# Patient Record
Sex: Female | Born: 1960 | Race: Black or African American | Hispanic: No | Marital: Married | Smoking: Never smoker
Health system: Southern US, Community
[De-identification: ages and names within clinical notes are randomized; demographics above are authoritative.]

## PROBLEM LIST (undated history)

## (undated) DIAGNOSIS — F329 Major depressive disorder, single episode, unspecified: Secondary | ICD-10-CM

## (undated) DIAGNOSIS — E669 Obesity, unspecified: Secondary | ICD-10-CM

## (undated) DIAGNOSIS — F32A Depression, unspecified: Secondary | ICD-10-CM

## (undated) DIAGNOSIS — R197 Diarrhea, unspecified: Secondary | ICD-10-CM

## (undated) DIAGNOSIS — G709 Myoneural disorder, unspecified: Secondary | ICD-10-CM

## (undated) DIAGNOSIS — G4733 Obstructive sleep apnea (adult) (pediatric): Secondary | ICD-10-CM

## (undated) DIAGNOSIS — I10 Essential (primary) hypertension: Secondary | ICD-10-CM

## (undated) DIAGNOSIS — F419 Anxiety disorder, unspecified: Secondary | ICD-10-CM

## (undated) DIAGNOSIS — R7303 Prediabetes: Secondary | ICD-10-CM

## (undated) DIAGNOSIS — G4730 Sleep apnea, unspecified: Secondary | ICD-10-CM

## (undated) HISTORY — DX: Diarrhea, unspecified: R19.7

---

## 2005-01-03 HISTORY — PX: ABDOMINAL HYSTERECTOMY: SHX81

## 2007-07-10 ENCOUNTER — Ambulatory Visit: Payer: Self-pay | Admitting: Urology

## 2007-08-24 ENCOUNTER — Ambulatory Visit: Payer: Self-pay | Admitting: Urology

## 2011-06-14 ENCOUNTER — Ambulatory Visit: Payer: Self-pay | Admitting: Obstetrics and Gynecology

## 2011-07-12 ENCOUNTER — Ambulatory Visit: Payer: Self-pay | Admitting: Obstetrics and Gynecology

## 2011-07-25 ENCOUNTER — Ambulatory Visit: Payer: Self-pay | Admitting: Family Medicine

## 2011-12-19 ENCOUNTER — Ambulatory Visit: Payer: Self-pay | Admitting: Gastroenterology

## 2012-01-04 HISTORY — PX: CHOLECYSTECTOMY: SHX55

## 2012-01-09 ENCOUNTER — Ambulatory Visit: Payer: Self-pay | Admitting: Emergency Medicine

## 2012-01-09 LAB — HEPATIC FUNCTION PANEL A (ARMC)
Albumin: 3.7 g/dL (ref 3.4–5.0)
Alkaline Phosphatase: 76 U/L (ref 50–136)
Bilirubin, Direct: 0.1 mg/dL (ref 0.00–0.20)
SGPT (ALT): 31 U/L (ref 12–78)
Total Protein: 8.4 g/dL — ABNORMAL HIGH (ref 6.4–8.2)

## 2012-01-12 ENCOUNTER — Ambulatory Visit: Payer: Self-pay | Admitting: General Surgery

## 2012-01-13 LAB — COMPREHENSIVE METABOLIC PANEL
Albumin: 3.1 g/dL — ABNORMAL LOW (ref 3.4–5.0)
Alkaline Phosphatase: 68 U/L (ref 50–136)
BUN: 7 mg/dL (ref 7–18)
Bilirubin,Total: 0.7 mg/dL (ref 0.2–1.0)
Calcium, Total: 8.4 mg/dL — ABNORMAL LOW (ref 8.5–10.1)
Chloride: 106 mmol/L (ref 98–107)
EGFR (Non-African Amer.): >60
SGOT(AST): 42 U/L — ABNORMAL HIGH (ref 15–37)
SGPT (ALT): 81 U/L — ABNORMAL HIGH (ref 12–78)

## 2012-01-13 LAB — CBC WITH DIFFERENTIAL/PLATELET
Basophil #: 0 10*3/uL (ref 0.0–0.1)
Eosinophil #: 0 10*3/uL (ref 0.0–0.7)
Eosinophil %: 0.2 %
HCT: 34.7 % — ABNORMAL LOW (ref 35.0–47.0)
Lymphocyte #: 2.3 10*3/uL (ref 1.0–3.6)
MCH: 27.3 pg (ref 26.0–34.0)
MCHC: 32.9 g/dL (ref 32.0–36.0)
MCV: 83 fL (ref 80–100)
Neutrophil #: 4.6 10*3/uL (ref 1.4–6.5)

## 2012-01-16 LAB — WOUND CULTURE

## 2012-01-16 LAB — PATHOLOGY REPORT

## 2013-12-09 LAB — HM MAMMOGRAPHY: HM MAMMO: NORMAL

## 2014-04-04 LAB — HM PAP SMEAR: HM Pap smear: NORMAL

## 2014-04-25 NOTE — Discharge Summary (Signed)
PATIENT NAME:  SALINDA, SNEDEKER MR#:  [id number] DATE OF BIRTH:  [birth year]  DATE OF ADMISSION:  01/12/2012 DATE OF DISCHARGE:  01/15/2012  This patient had a laparoscopic cholecystectomy performed without any problem. The patient postoperatively was nauseated and was complaining of a lot of abdominal pain. She was then admitted in the hospital and during that hospital stay, she was also complaining of some dizziness, so consultation was then taken with the hospitalist, who followed her medically. She was discharged home on symptomatic treatment with hyoscine and prilosec 40 mg daily and Zofran for nausea and vomiting. She was discharged from the hospital in satisfactory condition. She will be followed in my office.    ____________________________ Welford Roche. Phylis Bougie, MD msh:aw D: 01/27/2012 12:51:46 ET T: 01/27/2012 14:12:57 ET JOB#: [id number]  cc: Eunice Winecoff S. Phylis Bougie, MD, <Dictator> Sharene Butters MD ELECTRONICALLY SIGNED 01/29/2012 13:38

## 2014-04-25 NOTE — Op Note (Signed)
PATIENT NAME:  Miranda Evans, Miranda Evans MR#:  [id number] DATE OF BIRTH:  1960/07/21  DATE OF PROCEDURE:  01/12/2012   PREOPERATIVE DIAGNOSIS: Acute cholecystitis.   POSTOPERATIVE DIAGNOSIS: Acute cholecystitis.   PROCEDURE PERFORMED: Laparoscopic cholecystectomy.   SURGEON: Sharene Butters, M.D.   INDICATIONS: This patient was seen by me in my office, with right upper quadrant abdominal pain and the patient had a low ejection fraction. The patient was mostly pain in the right upper quadrant. She had an upper endoscopy, as well as colonoscopy, which was negative.   OPERATIVE FINDINGS: The patient had a lot of adhesions on the surface of the gallbladder, as well as a very distended gallbladder.   DESCRIPTION OF PROCEDURE: Under general anesthesia, the abdomen was then prepped and draped. A small incision was made above the umbilicus. After cutting skin and subcutaneous tissue, the fascia was cut. The abdomen was entered. Another trocar was inserted from the epigastric region under direct vision and two 5 mm were put in the right upper quadrant of the abdomen. The gallbladder first of all was distended and small.  It could not be grasped with the grasper, so I just decompressed from there and then grasped with a grasper and I was not able to lift completely above from the liver.  It was a little hard dissection.  Dissection was done, dissecting out all the adhesions down to the cystic duct/cystic artery area. The cystic artery was visualized and was found very nicely and then was clipped and cut. After that, the cystic duct was small. It was then dissected out completely. It was then clipped and cut. After that was done, the gallbladder was then removed from the liver bed and delivered.  Bleeding from the liver bed was stopped with the Bovie and the gallbladder was removed from the epigastric port. After that,         irrigation of the abdomen was performed which showed there was no bleeding and no  biliary leakage and then all the trocars were removed under direct vision.  The umbilical port was then closed with 0-Vicryl interrupted sutures. Marcaine was injected and then the wound was closed with staples. The patient tolerated the procedure well and was sent to the recovery room in satisfactory condition.   ____________________________ Welford Roche Phylis Bougie, MD msh:eg D: 01/12/2012 15:18:24 ET T: 01/12/2012 19:23:27 ET JOB#: [id number]  cc: Keni Elison S. Phylis Bougie, MD, <Dictator> Sharene Butters MD ELECTRONICALLY SIGNED 01/17/2012 16:27

## 2014-04-25 NOTE — Consult Note (Signed)
Brief Consult Note: Diagnosis: dizziness and lightheadedness.   Patient was seen by consultant.   Consult note dictated.   Discussed with Attending MD.  Electronic Signatures: James Ivanoff, Roselie Awkward (MD)  (Signed 10-Jan-14 14:41)  Authored: Brief Consult Note   Last Updated: 10-Jan-14 14:41 by James Ivanoff, Roselie Awkward (MD)

## 2014-04-25 NOTE — Consult Note (Signed)
PATIENT NAME:  Miranda Evans, Miranda Evans MR#:  [id number] DATE OF BIRTH:  05/02/1960  DATE OF CONSULTATION:  01/13/2012  REFERRING PHYSICIAN:  Masud S. Phylis Bougie, MD CONSULTING PHYSICIAN:  [place] Sink, MD  PRIMARY CARE PHYSICIAN:  REASON FOR CONSULTATION: Dizziness, lightheadedness. The patient does not feel very well to be discharged.   HISTORY OF PRESENT ILLNESS: The patient is a nice 54 year old female who has history of chronic abdominal pain. She has irritable bowel syndrome, and for about a year she has been having significant abdominal pain that is located in the epigastric area. The patient has had several tests done, workup done, including EGD and colonoscopy that did not show any specific findings. Ultrasound and CT scan of the abdomen were done as well without any major abnormalities. She had a HIDA scan which showed lower gallbladder function, for what she was referred to Dr. Phylis Bougie and underwent a cholecystectomy laparoscopically on 01/12/2012. The patient did not have any significant complications. She actually did quite well, but today she is not feeling very well. She was dizzy and lightheaded earlier and then she got up, walked around the hall and almost collapsed. She was grabbed by her family. There was never any shortness of breath, chest pain or loss of consciousness. She was just severely dizzy and lightheaded. She does not have any significant jaundice. No hematemesis. No changes of the color of the urine or stool. She has been nauseous today, but not vomiting. She states that she has not had a bowel movement in more than 6 days. Her regular bowel movements come daily. I was asked to assist on the case by Dr. Phylis Bougie due to the patient not feeling very well and her dizzy episode.   REVIEW OF SYSTEMS: CONSTITUTIONAL: Denies any fever, fatigue, weakness prior to surgery. Today she feels really weak. No weight loss or weight gain.  EYES: No blurry vision. No double vision. No  cataracts. No glaucoma.  ENT: No tinnitus. No difficulty swallowing. No upper respiratory signs of infection.  RESPIRATORY: No cough. No wheezing. No hemoptysis.  CARDIOVASCULAR: No chest pain, no orthopnea, no syncopal episodes, no palpitations, no arrhythmias.  GASTROINTESTINAL: Positive nausea, positive vomiting yesterday. No diarrhea. Positive constipation for over 6 days. Positive abdominal pain after surgery, epigastric mostly, radiating to the mesogastric area. The patient has abdominal distention. She has not passed any gas since prior to surgery. No rectal bleeding. No melena.  GENITOURINARY: No dysuria or hematuria. No frequency or incontinence.  GYNECOLOGIC: No breast masses. No vaginal bleeding or significant vaginal discharge. Actually, the patient had a hysterectomy.  ENDOCRINE: No polyuria, polydipsia or polyphagia. No cold or heat intolerance.  HEMATOLOGIC, LYMPHATIC: No anemia, no easy bruising, no swollen glands that she knows of, although she has not had any hemoglobin checked prior to surgery.  MUSCULOSKELETAL: No significant neck pain, back pain, shoulder pain. No swelling. No gout.  NEUROLOGIC: No numbness. No tingling. No CVA. No TIA. No ataxia.  PSYCHIATRIC: The patient denies any significant anxiety or depression. She has not been treated for any psychiatric circumstances.   ALLERGIES: No known drug allergies.   PAST MEDICAL HISTORY:  1.  IBS.  2.  Chronic abdominal pain.   PAST SURGICAL HISTORY:  1.  Partial hysterectomy.  2.  Laparoscopic cholecystectomy on 01/12/2012.   SOCIAL HISTORY: The patient has never smoked. She does not really drink. She denies any IV drug abuse.   FAMILY HISTORY: Positive for coronary artery disease in her father, who also has  diabetes. Her mother had hypertension and colon cancer.   CURRENT MEDICATIONS: Omeprazole 20 mg b.i.d., Protonix 40 mg twice daily (recently started and omeprazole has been discontinued), metoclopramide p.r.n.,  Zofran p.r.n. and Tylox 5/325 mg every four hours p.r.n. pain. She is using lactated Ringer's at 125 an hour at the moment.   PHYSICAL EXAMINATION:  VITAL SIGNS: Blood pressure 112/74, pulse 85, respiratory rate 18, temperature 98.4, o2 sats 99% on room air.  GENERAL: The patient is alert, oriented x 3, no acute distress, no respiratory distress, hemodynamically stable.  HEENT: Pupils are equal and reactive. Extraocular movements are intact. Mucosa are a little bit dry. Anicteric sclerae. Pink conjunctivae. No oral lesions. No oropharyngeal exudates. No thrush.  NECK: Supple. No JVD. No thyromegaly. No adenopathy. No carotid bruits. Normal range of motion. No rigidity.  CARDIOVASCULAR: Regular rate and rhythm. No murmurs, rubs or gallops. No tenderness to palpation of the anterior chest. There is no displacement of PMI.  LUNGS: Clear without any wheezing or crepitus. No use of accessory muscles.  ABDOMEN: Distended but soft. Tender to palpation on the epigastric and right upper quadrant but no rebound, no guarding. Bowel sounds are decreased on the distal area, mildly increased on the superior aspect of the abdomen.  GENITALIA: Deferred.  EXTREMITIES: No edema, no cyanosis, no clubbing. Pulses +2. Capillary refill less than 3.  NEUROLOGIC: Cranial nerves II through XII grossly intact. No focal findings.  PSYCHIATRIC: Mood: The patient seems very upset with her situation, not happy at this moment, and she is very short to answer questions with just "no" or "I don't know," so she has to be asked several times for the same questions. She looks a little bit tired and somnolent whenever you stop asking her questions.  MUSCULOSKELETAL: No significant lesions on joints or joint exudates.  LYMPHATIC: Negative for lymphadenopathy in neck or supraclavicular areas.   LABORATORY DATA: No hemoglobin seen on her chart or on this hospitalization. The patient had normal LFTs and a culture from gallbladder that is  not growing any bacteria.   ASSESSMENT AND PLAN: A 54 year old female, patient of Dr. Phylis Bougie, who comes for chronic abdominal pain and a HIDA scan that showed decreased gallbladder function, for what she underwent a cholecystectomy yesterday. Surgery was done successfully, but today she had a dizzy spell, almost fell while ambulating on the floor, and I was asked to assist on the case today.  1.  Status post cholecystectomy. The patient is doing fine. So far, there are no signs of infection. No signs of complications.  2.  Dizzy spell. The patient had dizziness, but it was related with ambulation. It did not happen related to any chest pain, shortness of breath or other cardiac instability. Her vital signs have been stable. She is getting good amount of fluids. Her blood pressure was 112/74 and her heart rate was mildly elevated yesterday in the 100s, right now is around 95 to 85. I am going to obtain orthostatic vital signs. I am going to check a hemoglobin and check all her chemistries. The patient is to continue same amount of fluids for now. Also, I would recommend to slow down on the dose of narcotics since the patient looks a little somnolent during the interview and this could be the major player on her symptomatology. Will follow up on those results.  3.  Significant constipation, abdominal pain. The patient is distended, has not had a bowel movement in over 6 days. I am going to  obtain a KUB, but this is likely an ileus due to constipation or obstipation, for what I will recommend stool softeners and catharsis.  4.  Irritable bowel syndrome. Continue general measurements.  5.  Deep vein thrombosis prophylaxis. The patient needs to ambulate a little bit more. If she does not, will consider putting her on heparin subcutaneously. At this moment it will be safe after her surgery, but first we are going to check her CBC.  6.  She is a full code.   TIME SPENT: I spent about 45 minutes talking with the  patient and family and reviewing the chart and talking with the attending.  Thank you, Dr. Phylis Bougie, for allowing me to participate in the care of this really nice patient.    ____________________________ [place] Sink, MD rsg:jm D: 01/13/2012 14:56:49 ET T: 01/13/2012 17:36:50 ET JOB#: [id number]  cc: [place] Sink, MD, <Dictator> Clio Gerhart America Brown MD ELECTRONICALLY SIGNED 01/27/2012 10:53

## 2014-08-20 ENCOUNTER — Ambulatory Visit (INDEPENDENT_AMBULATORY_CARE_PROVIDER_SITE_OTHER): Payer: BLUE CROSS/BLUE SHIELD | Admitting: Nurse Practitioner

## 2014-08-20 ENCOUNTER — Encounter: Payer: Self-pay | Admitting: Nurse Practitioner

## 2014-08-20 ENCOUNTER — Encounter (INDEPENDENT_AMBULATORY_CARE_PROVIDER_SITE_OTHER): Payer: Self-pay

## 2014-08-20 VITALS — BP 118/78 | HR 90 | Temp 98.2°F | Resp 14 | Ht 66.0 in | Wt 220.8 lb

## 2014-08-20 DIAGNOSIS — M545 Low back pain, unspecified: Secondary | ICD-10-CM | POA: Insufficient documentation

## 2014-08-20 DIAGNOSIS — Z7189 Other specified counseling: Secondary | ICD-10-CM | POA: Diagnosis not present

## 2014-08-20 DIAGNOSIS — Z7689 Persons encountering health services in other specified circumstances: Secondary | ICD-10-CM

## 2014-08-20 MED ORDER — CYCLOBENZAPRINE HCL 5 MG PO TABS: 5.0000 mg | ORAL_TABLET | Freq: Every day | ORAL | Status: DC

## 2014-08-20 NOTE — Progress Notes (Addendum)
Patient ID: Miranda Evans, female    DOB: 09/15/1960  Age: 54 y.o. MRN: [id number]  CC: Establish Care   HPI Miranda Evans presents for establishing care and CC of lower left back pain x 4 days.   1) New pt info:   Immunizations- unknown   Mammogram- Dec. 2015 Normal   Pap- 04/2014 last pelvic exam; Partial Hysterectomy    May keep GYN   Colonoscopy- 3 years ago could not go through with it  Eye Exam- 02/2014 approx; glasses   2) Chronic Problems-    None  3) Acute Problems-   Back pain- Moved a heavy box at work, Monday was sore/warm/aching into left posterior thigh. Pt denies burning/stinging/numbness/tingling. Denies prior episodes  Aleve- helpful   History Miranda has no past medical history on file.   She has past surgical history that includes Cholecystectomy (2014) and Abdominal hysterectomy (2007).   Her family history includes Cancer in her brother, maternal aunt, maternal uncle, paternal aunt, and paternal uncle; Diabetes in her father, maternal aunt, maternal uncle, paternal aunt, and paternal uncle; Hyperlipidemia in her maternal aunt and mother; Hypertension in her maternal aunt and mother.She reports that she has never smoked. She has never used smokeless tobacco. She reports that she drinks alcohol. She reports that she does not use illicit drugs.  No outpatient prescriptions prior to visit.   No facility-administered medications prior to visit.    ROS Review of Systems  Constitutional: Negative for fever, chills, diaphoresis and fatigue.  Respiratory: Negative for chest tightness, shortness of breath and wheezing.   Cardiovascular: Negative for chest pain, palpitations and leg swelling.  Gastrointestinal: Negative for nausea, vomiting and diarrhea.  Musculoskeletal: Positive for back pain. Negative for myalgias, joint swelling, arthralgias, gait problem, neck pain and neck stiffness.  Skin: Negative for rash.  Neurological: Negative for dizziness, weakness,  numbness and headaches.  Psychiatric/Behavioral: The patient is not nervous/anxious.     Objective:  BP 118/78 mmHg  Pulse 90  Temp(Src) 98.2 F (36.8 C)  Resp 14  Ht 5\' 6"  (1.676 m)  Wt 220 lb 12.8 oz (100.154 kg)  BMI 35.65 kg/m2  SpO2 95%  Physical Exam  Constitutional: She is oriented to person, place, and time. She appears well-developed and well-nourished. No distress.  HENT:  Head: Normocephalic and atraumatic.  Right Ear: External ear normal.  Left Ear: External ear normal.  2 well-healed scars on left cheek  Cardiovascular: Normal rate, regular rhythm and normal heart sounds.  Exam reveals no gallop and no friction rub.   No murmur heard. Pulmonary/Chest: Effort normal and breath sounds normal. No respiratory distress. She has no wheezes. She has no rales. She exhibits no tenderness.  Musculoskeletal: Normal range of motion. She exhibits tenderness. She exhibits no edema.  Tender to palpation of paraspinal muscles mainly on left negative straight leg raise   Neurological: She is alert and oriented to person, place, and time. No cranial nerve deficit. She exhibits normal muscle tone. Coordination normal.  Skin: Skin is warm and dry. No rash noted. She is not diaphoretic.  Psychiatric: She has a normal mood and affect. Her behavior is normal. Judgment and thought content normal.    Assessment & Plan:   Miranda Evans was seen today for establish care.  Diagnoses and all orders for this visit:  Encounter to establish care  Left-sided low back pain without sciatica  Other orders -     cyclobenzaprine (FLEXERIL) 5 MG tablet; Take 1 tablet (5 mg  total) by mouth at bedtime.   [name]  Meds ordered this encounter  Medications  . cyclobenzaprine (FLEXERIL) 5 MG tablet    Sig: Take 1 tablet (5 mg total) by mouth at bedtime.    Dispense:  30 tablet    Refill:  0    Order Specific Question:  Supervising Provider    Answer:  Crecencio Mc [[id number]]     Follow-up: Return in about 6 months (around 02/20/2015) for f/u.

## 2014-08-20 NOTE — Patient Instructions (Signed)
Welcome to [facility]!   Follow up in 6 months or sooner if sick.   Flexeril 1 tablet at night for your back, do light stretching and ice as needed. Call me if it worsens. Advil/ibuprofen is okay to continue.

## 2014-08-20 NOTE — Assessment & Plan Note (Signed)
Discussed acute and chronic issues. Reviewed health maintenance measures, PFSHx, and immunizations. Obtain records from previous facility.   

## 2014-08-20 NOTE — Progress Notes (Signed)
Pre visit review using our clinic review tool, if applicable. No additional management support is needed unless otherwise documented below in the visit note. 

## 2014-08-20 NOTE — Assessment & Plan Note (Signed)
Probable muscle spasm. Patient encouraged to use ice, rest, and NSAIDs. Patient was given Flexeril 5 mg to take at nighttime. Discouraged driving or using heavy machinery. Patient encouraged to call if conservative therapy is not helpful discussed possible use of prednisone taper.

## 2014-08-27 ENCOUNTER — Telehealth: Payer: Self-pay | Admitting: *Deleted

## 2014-08-28 ENCOUNTER — Other Ambulatory Visit: Payer: Self-pay | Admitting: Nurse Practitioner

## 2014-08-28 MED ORDER — PREDNISONE 10 MG PO TABS: ORAL_TABLET | ORAL | Status: DC

## 2014-08-28 NOTE — Telephone Encounter (Signed)
Awesome thanks! Sent to [facility] [place] [facility]

## 2014-08-28 NOTE — Telephone Encounter (Signed)
We can try a prednisone taper if she is agreeable. This will decrease inflammation and she cannot take any NSAIDs while on this. Thanks!

## 2014-08-28 NOTE — Telephone Encounter (Signed)
Spoke with pt, she is agreeable to the Prednisone taper.  Pt verbalizes understanding of no NSAIDS.  Please send Rx

## 2014-08-28 NOTE — Telephone Encounter (Signed)
No note attached. Please re-send.

## 2014-08-28 NOTE — Telephone Encounter (Signed)
Pt called states the muscle relaxer is not effective.  Please advise.

## 2015-04-16 ENCOUNTER — Emergency Department: Payer: No Typology Code available for payment source

## 2015-04-16 ENCOUNTER — Emergency Department
Admission: EM | Admit: 2015-04-16 | Discharge: 2015-04-16 | Disposition: A | Discharge status: Home / Self Care | Payer: No Typology Code available for payment source | Attending: Emergency Medicine | Admitting: Emergency Medicine

## 2015-04-16 ENCOUNTER — Encounter: Payer: Self-pay | Admitting: Emergency Medicine

## 2015-04-16 DIAGNOSIS — S161XXA Strain of muscle, fascia and tendon at neck level, initial encounter: Secondary | ICD-10-CM | POA: Diagnosis not present

## 2015-04-16 DIAGNOSIS — Y999 Unspecified external cause status: Secondary | ICD-10-CM | POA: Diagnosis not present

## 2015-04-16 DIAGNOSIS — Y9389 Activity, other specified: Secondary | ICD-10-CM | POA: Insufficient documentation

## 2015-04-16 DIAGNOSIS — M25512 Pain in left shoulder: Secondary | ICD-10-CM | POA: Diagnosis not present

## 2015-04-16 DIAGNOSIS — S4992XA Unspecified injury of left shoulder and upper arm, initial encounter: Secondary | ICD-10-CM | POA: Diagnosis present

## 2015-04-16 DIAGNOSIS — Y92410 Unspecified street and highway as the place of occurrence of the external cause: Secondary | ICD-10-CM | POA: Insufficient documentation

## 2015-04-16 MED ORDER — ETODOLAC 400 MG PO TABS: 400.0000 mg | ORAL_TABLET | Freq: Two times a day (BID) | ORAL | Status: DC

## 2015-04-16 MED ORDER — CYCLOBENZAPRINE HCL 5 MG PO TABS: 5.0000 mg | ORAL_TABLET | Freq: Three times a day (TID) | ORAL | Status: DC | PRN

## 2015-04-16 NOTE — ED Provider Notes (Signed)
ED ECG REPORT I, QUALE, MARK, the attending physician, personally viewed and interpreted this ECG.  Date: 04/16/2015 EKG Time: 858 Rate: 90 Rhythm: normal sinus rhythm QRS Axis: normal Intervals: normal ST/T Wave abnormalities: normal Conduction Disturbances: none Narrative Interpretation: unremarkable   Delman Kitten, MD 04/16/15 [phone]
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
## 2015-04-16 NOTE — Discharge Instructions (Signed)
Follow-up with your primary care doctor if any continued problems. You need to call and make an appointment. Begin taking medication as directed. Etodolac twice a day with food and Flexeril 5 mg 3 times a day as needed for muscle spasms. Moist heat or ice to muscles as needed for comfort.

## 2015-04-22 ENCOUNTER — Encounter: Payer: Self-pay | Admitting: Nurse Practitioner

## 2015-04-22 ENCOUNTER — Ambulatory Visit (INDEPENDENT_AMBULATORY_CARE_PROVIDER_SITE_OTHER): Payer: BLUE CROSS/BLUE SHIELD | Admitting: Nurse Practitioner

## 2015-04-22 DIAGNOSIS — M25512 Pain in left shoulder: Secondary | ICD-10-CM

## 2015-04-22 DIAGNOSIS — T148XXA Other injury of unspecified body region, initial encounter: Secondary | ICD-10-CM

## 2015-04-22 DIAGNOSIS — T1490 Injury, unspecified: Secondary | ICD-10-CM | POA: Diagnosis not present

## 2015-04-22 DIAGNOSIS — S161XXD Strain of muscle, fascia and tendon at neck level, subsequent encounter: Secondary | ICD-10-CM

## 2015-04-22 MED ORDER — ALL-BODY MASSAGE MISC: 1.0000 "application " | Status: DC

## 2015-04-22 MED ORDER — PREDNISONE 10 MG PO TABS: ORAL_TABLET | ORAL | Status: DC
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
## 2015-04-22 NOTE — Patient Instructions (Addendum)
Prednisone with breakfast or lunch at the latest.  6 tablets on day 1, 5 tablets on day 2, 4 tablets on day 3, 3 tablets on day 4, 2 tablets day 5, 1 tablet on day 6...done! Take tablets all together not spaced out Don't take with NSAIDs (Ibuprofen, Aleve, Naproxen, Meloxicam, Etodolac ect...)  Can still take flexeril at night  Drop off FMLA paperwork at front desk

## 2015-04-23 ENCOUNTER — Telehealth: Payer: Self-pay | Admitting: Nurse Practitioner

## 2015-04-23 NOTE — Telephone Encounter (Signed)
Pt dropped of [employer] papers for Lorane Gell to complete. Papers are in C. [place]oss box.

## 2015-04-24 NOTE — Telephone Encounter (Signed)
Received. Will work on them

## 2015-04-26 DIAGNOSIS — S161XXA Strain of muscle, fascia and tendon at neck level, initial encounter: Secondary | ICD-10-CM | POA: Insufficient documentation

## 2015-04-26 DIAGNOSIS — M25512 Pain in left shoulder: Secondary | ICD-10-CM | POA: Insufficient documentation

## 2015-04-26 DIAGNOSIS — T148XXA Other injury of unspecified body region, initial encounter: Secondary | ICD-10-CM | POA: Insufficient documentation

## 2015-04-26 NOTE — Assessment & Plan Note (Signed)
Same as tx for myalgias below

## 2015-04-26 NOTE — Assessment & Plan Note (Signed)
X-rays normal See Myalgias for tx regimen Added Massage x 3, 1 every 2 weeks for 1 hour to help relieve tension

## 2015-04-26 NOTE — Assessment & Plan Note (Signed)
Multiple areas are still bothering her and causing her to have anxiety, poor sleep, and feeling tense. Flexeril continue- prednisone sent to pharmacy- discussed no NSAIDs, tylenol okay, and how to take on AVS. FU in approx 2 weeks

## 2015-04-26 NOTE — Assessment & Plan Note (Signed)
See ED note for more information and imaging

## 2015-05-01 NOTE — Telephone Encounter (Signed)
Patient had requested a follow on FMLA paperwork follow up Pt Contact [phone].

## 2015-05-01 NOTE — Telephone Encounter (Signed)
Patient states she dropped of FMLA paperwork 4/20 and needs completed forms OR a letter sent to her employer ([employer]) by Monday stating she is still out.     Fax attention to:  Earlie Lou, fax [phone], phone [phone]

## 2015-05-04 NOTE — Telephone Encounter (Signed)
Left a VM that paperwork is ready to be picked up.

## 2015-05-04 NOTE — Telephone Encounter (Signed)
Thanks

## 2015-05-06 ENCOUNTER — Encounter: Payer: Self-pay | Admitting: Nurse Practitioner

## 2015-05-06 ENCOUNTER — Ambulatory Visit (INDEPENDENT_AMBULATORY_CARE_PROVIDER_SITE_OTHER): Payer: Self-pay | Admitting: Nurse Practitioner

## 2015-05-06 DIAGNOSIS — M545 Low back pain, unspecified: Secondary | ICD-10-CM

## 2015-05-06 DIAGNOSIS — S161XXD Strain of muscle, fascia and tendon at neck level, subsequent encounter: Secondary | ICD-10-CM

## 2015-05-06 DIAGNOSIS — M25512 Pain in left shoulder: Secondary | ICD-10-CM

## 2015-05-06 DIAGNOSIS — T1490 Injury, unspecified: Secondary | ICD-10-CM

## 2015-05-06 DIAGNOSIS — T148XXA Other injury of unspecified body region, initial encounter: Secondary | ICD-10-CM

## 2015-05-06 MED ORDER — CYCLOBENZAPRINE HCL 5 MG PO TABS: 5.0000 mg | ORAL_TABLET | Freq: Every day | ORAL | Status: DC

## 2015-05-06 MED ORDER — ETODOLAC 400 MG PO TABS
400.0000 mg | ORAL_TABLET | Freq: Two times a day (BID) | ORAL | Status: DC
Start: 2015-05-06 — End: 2015-07-31

## 2015-05-06 NOTE — Assessment & Plan Note (Signed)
PT ordered Medication refills sent to pharmacy FU in 2 weeks

## 2015-05-06 NOTE — Assessment & Plan Note (Signed)
[employer] disability form filled out and given back to pt same day  Copy in folder above CMA station in purple folder kept for 1 month  Medications sent to pharmacy Out of work note for 2 more weeks FU in 2 weeks for re-eval PT ordered

## 2015-05-06 NOTE — Assessment & Plan Note (Signed)
Filled out [employer] disability page- returned to pt same day as visit and copy was kept at [facility] desk for 1 month.

## 2015-05-06 NOTE — Progress Notes (Signed)
Pre visit review using our clinic review tool, if applicable. No additional management support is needed unless otherwise documented below in the visit note. 

## 2015-05-06 NOTE — Patient Instructions (Signed)
We will call you about your referral to physical therapy.   Etodolac up to twice daily (am and pm) with food on your stomach only as needed for pain.   Flexeril at night for muscle relaxation.   Follow up in 2 weeks.
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
## 2015-05-15 ENCOUNTER — Ambulatory Visit (INDEPENDENT_AMBULATORY_CARE_PROVIDER_SITE_OTHER): Payer: Self-pay | Admitting: Nurse Practitioner

## 2015-05-15 ENCOUNTER — Encounter: Payer: Self-pay | Admitting: Nurse Practitioner

## 2015-05-15 VITALS — BP 128/80 | HR 112 | Resp 12 | Ht 66.0 in | Wt 221.0 lb

## 2015-05-15 DIAGNOSIS — S161XXD Strain of muscle, fascia and tendon at neck level, subsequent encounter: Secondary | ICD-10-CM

## 2015-05-15 DIAGNOSIS — T148XXA Other injury of unspecified body region, initial encounter: Secondary | ICD-10-CM

## 2015-05-15 DIAGNOSIS — T1490 Injury, unspecified: Secondary | ICD-10-CM

## 2015-05-15 DIAGNOSIS — M545 Low back pain, unspecified: Secondary | ICD-10-CM

## 2015-05-15 DIAGNOSIS — M25512 Pain in left shoulder: Secondary | ICD-10-CM

## 2015-05-15 MED ORDER — GABAPENTIN 300 MG PO CAPS: 300.0000 mg | ORAL_CAPSULE | Freq: Every day | ORAL | Status: DC

## 2015-05-15 NOTE — Patient Instructions (Addendum)
[facility]  Address: [address] [place], [place] [address]  Phone: [phone]  Gabapentin tonight!
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
## 2015-05-15 NOTE — Assessment & Plan Note (Addendum)
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

## 2015-05-15 NOTE — Assessment & Plan Note (Signed)
PT info given to pt to call and get set up Note for 5 weeks out of work- [employer] and Short term disability in place  Gabapentin for radicular pain of lumbar spine and possibly cervical

## 2015-06-05 ENCOUNTER — Ambulatory Visit
Admission: RE | Admit: 2015-06-05 | Discharge: 2015-06-05 | Disposition: A | Discharge status: Home / Self Care | Payer: No Typology Code available for payment source | Source: Ambulatory Visit | Attending: Family Medicine | Admitting: Family Medicine

## 2015-06-05 ENCOUNTER — Ambulatory Visit (INDEPENDENT_AMBULATORY_CARE_PROVIDER_SITE_OTHER): Payer: Self-pay | Admitting: Family Medicine

## 2015-06-05 ENCOUNTER — Telehealth: Payer: Self-pay | Admitting: Family Medicine

## 2015-06-05 ENCOUNTER — Encounter: Payer: Self-pay | Admitting: Family Medicine

## 2015-06-05 ENCOUNTER — Telehealth: Payer: Self-pay

## 2015-06-05 VITALS — BP 110/76 | HR 94 | Temp 98.2°F | Ht 66.0 in | Wt 221.6 lb

## 2015-06-05 DIAGNOSIS — R519 Headache, unspecified: Secondary | ICD-10-CM

## 2015-06-05 DIAGNOSIS — R51 Headache: Secondary | ICD-10-CM | POA: Insufficient documentation

## 2015-06-05 DIAGNOSIS — S161XXD Strain of muscle, fascia and tendon at neck level, subsequent encounter: Secondary | ICD-10-CM

## 2015-06-05 MED ORDER — PROMETHAZINE HCL 12.5 MG PO TABS: 12.5000 mg | ORAL_TABLET | Freq: Three times a day (TID) | ORAL | Status: DC | PRN

## 2015-06-05 NOTE — Assessment & Plan Note (Signed)
Suspect muscular back pain and neck pain related to muscular strain. Discussed continuing physical therapy and continuing gabapentin. She'll continue to monitor. Given return precautions.

## 2015-06-05 NOTE — Telephone Encounter (Signed)
Stat Results called to the office,   CT of the head: Normal CT.  Please advise. Thanks
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
## 2015-06-05 NOTE — Telephone Encounter (Signed)
Pt returned call for CT results.

## 2015-06-05 NOTE — Telephone Encounter (Signed)
Notified patient.

## 2015-06-05 NOTE — Patient Instructions (Signed)
Nice to see you. We are going to get the CT scan of her head given her headaches. We will start her on Phenergan for possible migraine component. You should continue physical therapy. You should continue your current medications. If he develops sudden worsening of your headaches, nausea, vomiting, vision changes, numbness, weakness, or any new or changing symptoms please seek medical attention.

## 2015-06-05 NOTE — Progress Notes (Signed)
Pre visit review using our clinic review tool, if applicable. No additional management support is needed unless otherwise documented below in the visit note. 
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
## 2015-06-19 ENCOUNTER — Ambulatory Visit (INDEPENDENT_AMBULATORY_CARE_PROVIDER_SITE_OTHER): Payer: BLUE CROSS/BLUE SHIELD

## 2015-06-19 ENCOUNTER — Encounter: Payer: Self-pay | Admitting: Family Medicine

## 2015-06-19 ENCOUNTER — Ambulatory Visit (INDEPENDENT_AMBULATORY_CARE_PROVIDER_SITE_OTHER): Payer: Self-pay | Admitting: Family Medicine

## 2015-06-19 ENCOUNTER — Encounter: Payer: Self-pay | Admitting: Surgical

## 2015-06-19 DIAGNOSIS — M545 Low back pain, unspecified: Secondary | ICD-10-CM

## 2015-06-19 MED ORDER — PREDNISONE 10 MG PO TABS: ORAL_TABLET | ORAL | Status: DC

## 2015-06-19 NOTE — Patient Instructions (Signed)
Nice to meet you. We will start you on prednisone to help with the inflammation.  We will obtain an x-ray of your low back today and MRI in the next several weeks.  Please continue the gabapentin. Do not take the lodine while taking the prednisone.  If you develop worsening back pain, numbness, weakness, loss of bowel or bladder function, numbness between her legs, fevers, or any new or changing symptoms please seek medical attention.
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
## 2015-06-19 NOTE — Progress Notes (Signed)
Pre visit review using our clinic review tool, if applicable. No additional management support is needed unless otherwise documented below in the visit note. 

## 2015-06-19 NOTE — Assessment & Plan Note (Signed)
Patient with persistent low back pain. Now worse on the right than the left. Sciatic on the right. Did note one to 2 episodes of leakage of a small amount of urine several weeks ago though no recurrence. No other red flags. Neurologically intact. Given persistence and lack of prior imaging will obtain an x-ray of her lumbar spine today. She will need an MRI though not emergently given lack of recurrent urinary issues. We'll give a trial of prednisone given her radicular symptoms. She'll continue to monitor. She is given return precautions.

## 2015-07-03 ENCOUNTER — Ambulatory Visit (INDEPENDENT_AMBULATORY_CARE_PROVIDER_SITE_OTHER): Payer: BLUE CROSS/BLUE SHIELD | Admitting: Family Medicine

## 2015-07-03 ENCOUNTER — Encounter: Payer: Self-pay | Admitting: Family Medicine

## 2015-07-03 VITALS — BP 132/84 | HR 103 | Temp 98.3°F | Ht 66.0 in | Wt 220.4 lb

## 2015-07-03 DIAGNOSIS — R079 Chest pain, unspecified: Secondary | ICD-10-CM | POA: Diagnosis not present

## 2015-07-03 DIAGNOSIS — R0789 Other chest pain: Secondary | ICD-10-CM

## 2015-07-03 DIAGNOSIS — M545 Low back pain, unspecified: Secondary | ICD-10-CM

## 2015-07-03 DIAGNOSIS — R1013 Epigastric pain: Secondary | ICD-10-CM | POA: Diagnosis not present

## 2015-07-03 DIAGNOSIS — R197 Diarrhea, unspecified: Secondary | ICD-10-CM

## 2015-07-03 DIAGNOSIS — G894 Chronic pain syndrome: Secondary | ICD-10-CM

## 2015-07-03 HISTORY — DX: Diarrhea, unspecified: R19.7

## 2015-07-03 LAB — COMPREHENSIVE METABOLIC PANEL
ALK PHOS: 79 U/L (ref 39–117)
ALT: 18 U/L (ref 0–35)
AST: 11 U/L (ref 0–37)
Albumin: 4.1 g/dL (ref 3.5–5.2)
BILIRUBIN TOTAL: 0.6 mg/dL (ref 0.2–1.2)
BUN: 18 mg/dL (ref 6–23)
CALCIUM: 9.3 mg/dL (ref 8.4–10.5)
CO2: 29 mEq/L (ref 19–32)
Chloride: 103 mEq/L (ref 96–112)
Creatinine, Ser: 0.79 mg/dL (ref 0.40–1.20)
GFR: 97.87 mL/min (ref >60.00)
Glucose, Bld: 103 mg/dL — ABNORMAL HIGH (ref 70–99)
POTASSIUM: 4.1 meq/L (ref 3.5–5.1)
Sodium: 139 mEq/L (ref 135–145)
TOTAL PROTEIN: 7.8 g/dL (ref 6.0–8.3)

## 2015-07-03 LAB — CBC
HEMATOCRIT: 41 % (ref 36.0–46.0)
HEMOGLOBIN: 13.4 g/dL (ref 12.0–15.0)
MCHC: 32.7 g/dL (ref 30.0–36.0)
MCV: 82.3 fl (ref 78.0–100.0)
PLATELETS: 324 10*3/uL (ref 150.0–400.0)
RBC: 4.99 Mil/uL (ref 3.87–5.11)
RDW: 14.1 % (ref 11.5–15.5)
WBC: 6.8 10*3/uL (ref 4.0–10.5)

## 2015-07-03 LAB — TROPONIN I: TNIDX: 0 ug/L (ref 0.00–0.06)

## 2015-07-03 LAB — LIPASE: Lipase: 12 U/L (ref 11.0–59.0)

## 2015-07-03 MED ORDER — PROMETHAZINE HCL 12.5 MG PO TABS: 12.5000 mg | ORAL_TABLET | Freq: Three times a day (TID) | ORAL | Status: DC | PRN

## 2015-07-03 MED ORDER — GABAPENTIN 300 MG PO CAPS: 300.0000 mg | ORAL_CAPSULE | Freq: Every day | ORAL | Status: DC
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
## 2015-07-03 NOTE — Assessment & Plan Note (Signed)
Patient with several days of diarrhea and some mild abdominal discomfort. Mild tenderness in the epigastric region. Symptoms could be related to medication versus viral infection. Given tenderness we will obtain labs as outlined below. She does have hemorrhoids and I advised to continue Preparation H as these appeared to have improved significantly recently. She'll stay well hydrated. Suspect some of her mild tachycardia could be related to dehydration from the diarrhea or discomfort from her chronic pain. She'll continue to monitor. She's given return precautions.

## 2015-07-03 NOTE — Assessment & Plan Note (Signed)
Recent MRI with foraminal narrowing. We will refer to neurosurgery.
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
## 2015-07-03 NOTE — Progress Notes (Signed)
Pre visit review using our clinic review tool, if applicable. No additional management support is needed unless otherwise documented below in the visit note. 
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
## 2015-07-03 NOTE — Assessment & Plan Note (Addendum)
Patient with atypical left-sided chest pain. Atypical in that it does not radiate, is not associated with shortness of breath, is not exertional, no associated diaphoresis, and is reproducible on exam. Also somewhat atypical that it has been constant for 3 days with some easing up. EKG reassuring. Suspect musculoskeletal cause, though given persistence and family history we will obtain a troponin to rule out cardiac disease though history is not consistent with cardiac issue. If cardiac cause would expect troponin to end up being positive after 3 days of discomfort. If negative highly doubt cardiac cause. Unlikely PE given normal oxygenation and wells score of 1.5 as low risk with no risk factors for PE. Unlikely pulmonary cause given normal pulmonary exam and oxygenation. History not consistent with GI cause. Could be musculoskeletal given reproducibility on exam. We'll await troponin and patient will monitor. She's given return precautions.

## 2015-07-14 ENCOUNTER — Telehealth: Payer: Self-pay | Admitting: Family Medicine

## 2015-07-14 NOTE — Telephone Encounter (Signed)
The patient has a question

## 2015-07-20 ENCOUNTER — Ambulatory Visit: Payer: BLUE CROSS/BLUE SHIELD | Admitting: Family Medicine

## 2015-07-23 ENCOUNTER — Telehealth: Payer: Self-pay | Admitting: *Deleted

## 2015-07-23 NOTE — Telephone Encounter (Signed)
Notified patient that we do not have the forms. She is going to have the company fax a copy to [place].

## 2015-07-23 NOTE — Telephone Encounter (Signed)
Do you have these forms?

## 2015-07-23 NOTE — Telephone Encounter (Signed)
Please advise, thanks.

## 2015-07-23 NOTE — Telephone Encounter (Signed)
Patient has requested a update on her short term disability paperwork, she also was checking on the progress of her referral.  Pt contact [phone]

## 2015-07-27 ENCOUNTER — Telehealth: Payer: Self-pay | Admitting: Family Medicine

## 2015-07-27 NOTE — Telephone Encounter (Signed)
Pt needs a cd of her xray that she had with [place]. She will pick it up on Friday while she is here for her appt with Dr. Caryl Bis.

## 2015-07-27 NOTE — Telephone Encounter (Signed)
CD has been made. Will place in pick up box up front.

## 2015-07-31 ENCOUNTER — Ambulatory Visit (INDEPENDENT_AMBULATORY_CARE_PROVIDER_SITE_OTHER): Payer: Self-pay | Admitting: Family Medicine

## 2015-07-31 ENCOUNTER — Encounter: Payer: Self-pay | Admitting: Surgical

## 2015-07-31 DIAGNOSIS — G894 Chronic pain syndrome: Secondary | ICD-10-CM

## 2015-07-31 MED ORDER — GABAPENTIN 300 MG PO CAPS
ORAL_CAPSULE | ORAL | 1 refills | Status: DC
Start: 2015-07-31 — End: 2016-02-12

## 2015-07-31 NOTE — Telephone Encounter (Signed)
Patient came in for appointment and spoke with insurance company. They are going to send different forms.

## 2015-07-31 NOTE — Patient Instructions (Signed)
Nice to see you. We will increase your gabapentin as outlined in the prescription. Please keep follow-up with Dr Joya Salm.  If you develop worsening discomfort, numbness, weakness, loss of bowel or bladder function, numbness between your legs, or any new or change in symptoms please seek medical attention.
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
## 2015-08-03 ENCOUNTER — Telehealth: Payer: Self-pay | Admitting: Family Medicine

## 2015-08-03 ENCOUNTER — Telehealth: Payer: Self-pay | Admitting: *Deleted

## 2015-08-03 ENCOUNTER — Ambulatory Visit: Payer: BLUE CROSS/BLUE SHIELD | Admitting: Family Medicine

## 2015-08-03 NOTE — Telephone Encounter (Signed)
Patient needs to contact neurosurgery and have them send [place] there office notes, thanks

## 2015-08-03 NOTE — Telephone Encounter (Signed)
Spoke with patient and advised that we can give her a work note to be out the next 2 days until we get the note from the Chief of Staff.

## 2015-08-03 NOTE — Telephone Encounter (Signed)
I'll need to see a copy of the office note from the neurosurgeon's office stating this. This was relayed to the patient by CMA. Please check with the patient to see if she contacted the neurosurgeon's office regarding this.

## 2015-08-03 NOTE — Telephone Encounter (Signed)
FYI Patient stated that progress notes from Neurosurgery will be available in 1-2 days.

## 2015-08-03 NOTE — Telephone Encounter (Signed)
Pt dropped off paper work for Dr. Caryl Bis. Paper is in a envelope in colored folder up front.

## 2015-08-03 NOTE — Telephone Encounter (Signed)
Patient stated that Neuro surgeon and spin stated that she should not return to work until after her MRI. She stated that she will need a note from her PCP stating the request. Her MRI has not been scheduled. She also had a fall this weekend that left her in pain.

## 2015-08-03 NOTE — Telephone Encounter (Signed)
Spoke with patient and advised per Dr. Caryl Bis that if we have to do the work note that she would need to contact the Neuro surgeon to contact Dr. Caryl Bis. She stated that the neuro sugreon said to get the letter from her PCP.

## 2015-08-03 NOTE — Telephone Encounter (Signed)
Please advise for work note, thanks

## 2015-08-03 NOTE — Telephone Encounter (Signed)
Notified patient that note for work will be up front for her to pick up. She said that the note from neuro surgery will note be ready for 1-2 days

## 2015-08-03 NOTE — Telephone Encounter (Signed)
Letter created and forwarded to CMA.

## 2015-08-04 NOTE — Telephone Encounter (Signed)
Faxed forms

## 2015-08-06 ENCOUNTER — Telehealth: Payer: Self-pay | Admitting: *Deleted

## 2015-08-06 NOTE — Telephone Encounter (Signed)
Patient stated that she will return back to work on 08/07/15. She has requested a restriction form. She also requested that [name] call her  Pt contact [phone]

## 2015-08-06 NOTE — Telephone Encounter (Signed)
Spoke with nurse at [facility] about doctors note for Miranda Evans. She stated that the doctor has not signed it yet. She did read some of the note for me and said that nothing was mentioned in the note about patients work statis. She did say that after the MRI is performed then they will decide on if the patient needs surgery or pain management.

## 2015-08-06 NOTE — Telephone Encounter (Signed)
Form up front for patient to pick up

## 2015-08-06 NOTE — Telephone Encounter (Signed)
Form completed and given to [facility] to place at the front.

## 2015-08-18 ENCOUNTER — Other Ambulatory Visit: Payer: Self-pay | Admitting: Neurosurgery

## 2015-08-18 DIAGNOSIS — M542 Cervicalgia: Secondary | ICD-10-CM

## 2015-08-27 ENCOUNTER — Ambulatory Visit
Admission: RE | Admit: 2015-08-27 | Discharge: 2015-08-27 | Disposition: A | Discharge status: Home / Self Care | Payer: BLUE CROSS/BLUE SHIELD | Source: Ambulatory Visit | Attending: Neurosurgery | Admitting: Neurosurgery

## 2015-08-27 DIAGNOSIS — M4802 Spinal stenosis, cervical region: Secondary | ICD-10-CM | POA: Insufficient documentation

## 2015-08-27 DIAGNOSIS — M50222 Other cervical disc displacement at C5-C6 level: Secondary | ICD-10-CM | POA: Insufficient documentation

## 2015-08-27 DIAGNOSIS — M542 Cervicalgia: Secondary | ICD-10-CM | POA: Diagnosis present

## 2015-08-31 ENCOUNTER — Telehealth: Payer: Self-pay | Admitting: Family Medicine

## 2015-08-31 MED ORDER — PROMETHAZINE HCL 12.5 MG PO TABS
12.5000 mg | ORAL_TABLET | Freq: Three times a day (TID) | ORAL | 0 refills | Status: DC | PRN
Start: 2015-08-31 — End: 2016-04-19

## 2015-08-31 NOTE — Telephone Encounter (Signed)
Please advise if this was received and completed? thanks

## 2015-08-31 NOTE — Telephone Encounter (Signed)
Spoke with patient and advised that we have not received any papers to fill out from insurance yet. She stated that insurance company is going to mail [place] forms. Can we refill the promethazine? She stated that it helps with headaches.
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
## 2015-08-31 NOTE — Telephone Encounter (Signed)
Promethazine sent to pharmacy. We'll fill out the forms when we receive them.

## 2015-09-16 ENCOUNTER — Other Ambulatory Visit: Payer: Self-pay | Admitting: Family Medicine

## 2015-09-29 ENCOUNTER — Encounter: Payer: Self-pay | Admitting: Physical Medicine & Rehabilitation

## 2015-09-29 ENCOUNTER — Encounter
Payer: BLUE CROSS/BLUE SHIELD | Attending: Physical Medicine & Rehabilitation | Admitting: Physical Medicine & Rehabilitation

## 2015-09-29 VITALS — BP 115/77 | HR 95 | Resp 14

## 2015-09-29 DIAGNOSIS — M47812 Spondylosis without myelopathy or radiculopathy, cervical region: Secondary | ICD-10-CM | POA: Insufficient documentation

## 2015-09-29 DIAGNOSIS — M5441 Lumbago with sciatica, right side: Secondary | ICD-10-CM

## 2015-09-29 DIAGNOSIS — G894 Chronic pain syndrome: Secondary | ICD-10-CM

## 2015-09-29 DIAGNOSIS — M47892 Other spondylosis, cervical region: Secondary | ICD-10-CM | POA: Diagnosis not present

## 2015-09-29 DIAGNOSIS — S3992XD Unspecified injury of lower back, subsequent encounter: Secondary | ICD-10-CM | POA: Diagnosis present

## 2015-09-29 DIAGNOSIS — Z79899 Other long term (current) drug therapy: Secondary | ICD-10-CM

## 2015-09-29 DIAGNOSIS — Z5181 Encounter for therapeutic drug level monitoring: Secondary | ICD-10-CM

## 2015-09-29 MED ORDER — DICLOFENAC SODIUM 75 MG PO TBEC: 75.0000 mg | DELAYED_RELEASE_TABLET | Freq: Two times a day (BID) | ORAL | 3 refills | Status: DC

## 2015-09-29 MED ORDER — METHOCARBAMOL 500 MG PO TABS: 500.0000 mg | ORAL_TABLET | Freq: Four times a day (QID) | ORAL | 2 refills | Status: DC | PRN

## 2015-09-29 NOTE — Patient Instructions (Signed)
Impingement Syndrome, Rotator Cuff, Bursitis With Rehab Impingement syndrome is a condition that involves inflammation of the tendons of the rotator cuff and the subacromial bursa, that causes pain in the shoulder. The rotator cuff consists of four tendons and muscles that control much of the shoulder and upper arm function. The subacromial bursa is a fluid filled sac that helps reduce friction between the rotator cuff and one of the bones of the shoulder (acromion). Impingement syndrome is usually an overuse injury that causes swelling of the bursa (bursitis), swelling of the tendon (tendonitis), and/or a tear of the tendon (strain). Strains are classified into three categories. Grade 1 strains cause pain, but the tendon is not lengthened. Grade 2 strains include a lengthened ligament, due to the ligament being stretched or partially ruptured. With grade 2 strains there is still function, although the function may be decreased. Grade 3 strains include a complete tear of the tendon or muscle, and function is usually impaired. SYMPTOMS   Pain around the shoulder, often at the outer portion of the upper arm.  Pain that gets worse with shoulder function, especially when reaching overhead or lifting.  Sometimes, aching when not using the arm.  Pain that wakes you up at night.  Sometimes, tenderness, swelling, warmth, or redness over the affected area.  Loss of strength.  Limited motion of the shoulder, especially reaching behind the back (to the back pocket or to unhook bra) or across your body.  Crackling sound (crepitation) when moving the arm.  Biceps tendon pain and inflammation (in the front of the shoulder). Worse when bending the elbow or lifting. CAUSES  Impingement syndrome is often an overuse injury, in which chronic (repetitive) motions cause the tendons or bursa to become inflamed. A strain occurs when a force is paced on the tendon or muscle that is greater than it can withstand.  Common mechanisms of injury include: Stress from sudden increase in duration, frequency, or intensity of training.  Direct hit (trauma) to the shoulder.  Aging, erosion of the tendon with normal use.  Bony bump on shoulder (acromial spur). RISK INCREASES WITH:  Contact sports (football, wrestling, boxing).  Throwing sports (baseball, tennis, volleyball).  Weightlifting and bodybuilding.  Heavy labor.  Previous injury to the rotator cuff, including impingement.  Poor shoulder strength and flexibility.  Failure to warm up properly before activity.  Inadequate protective equipment.  Old age.  Bony bump on shoulder (acromial spur). PREVENTION   Warm up and stretch properly before activity.  Allow for adequate recovery between workouts.  Maintain physical fitness:  Strength, flexibility, and endurance.  Cardiovascular fitness.  Learn and use proper exercise technique. PROGNOSIS  If treated properly, impingement syndrome usually goes away within 6 weeks. Sometimes surgery is required.  RELATED COMPLICATIONS   Longer healing time if not properly treated, or if not given enough time to heal.  Recurring symptoms, that result in a chronic condition.  Shoulder stiffness, frozen shoulder, or loss of motion.  Rotator cuff tendon tear.  Recurring symptoms, especially if activity is resumed too soon, with overuse, with a direct blow, or when using poor technique. TREATMENT  Treatment first involves the use of ice and medicine, to reduce pain and inflammation. The use of strengthening and stretching exercises may help reduce pain with activity. These exercises may be performed at home or with a therapist. If non-surgical treatment is unsuccessful after more than 6 months, surgery may be advised. After surgery and rehabilitation, activity is usually possible in 3 months.    MEDICATION  If pain medicine is needed, nonsteroidal anti-inflammatory medicines (aspirin and  ibuprofen), or other minor pain relievers (acetaminophen), are often advised.  Do not take pain medicine for 7 days before surgery.  Prescription pain relievers may be given, if your caregiver thinks they are needed. Use only as directed and only as much as you need.  Corticosteroid injections may be given by your caregiver. These injections should be reserved for the most serious cases, because they may only be given a certain number of times. HEAT AND COLD  Cold treatment (icing) should be applied for 10 to 15 minutes every 2 to 3 hours for inflammation and pain, and immediately after activity that aggravates your symptoms. Use ice packs or an ice massage.  Heat treatment may be used before performing stretching and strengthening activities prescribed by your caregiver, physical therapist, or athletic trainer. Use a heat pack or a warm water soak. SEEK MEDICAL CARE IF:   Symptoms get worse or do not improve in 4 to 6 weeks, despite treatment.  New, unexplained symptoms develop. (Drugs used in treatment may produce side effects.) EXERCISES  RANGE OF MOTION (ROM) AND STRETCHING EXERCISES - Impingement Syndrome (Rotator Cuff  Tendinitis, Bursitis) These exercises may help you when beginning to rehabilitate your injury. Your symptoms may go away with or without further involvement from your physician, physical therapist or athletic trainer. While completing these exercises, remember:   Restoring tissue flexibility helps normal motion to return to the joints. This allows healthier, less painful movement and activity.  An effective stretch should be held for at least 30 seconds.  A stretch should never be painful. You should only feel a gentle lengthening or release in the stretched tissue. STRETCH - Flexion, Standing  Stand with good posture. With an underhand grip on your right / left hand, and an overhand grip on the opposite hand, grasp a broomstick or cane so that your hands are a  little more than shoulder width apart.  Keeping your right / left elbow straight and shoulder muscles relaxed, push the stick with your opposite hand, to raise your right / left arm in front of your body and then overhead. Raise your arm until you feel a stretch in your right / left shoulder, but before you have increased shoulder pain.  Try to avoid shrugging your right / left shoulder as your arm rises, by keeping your shoulder blade tucked down and toward your mid-back spine. Hold for __________ seconds.  Slowly return to the starting position. Repeat __________ times. Complete this exercise __________ times per day. STRETCH - Abduction, Supine  Lie on your back. With an underhand grip on your right / left hand and an overhand grip on the opposite hand, grasp a broomstick or cane so that your hands are a little more than shoulder width apart.  Keeping your right / left elbow straight and your shoulder muscles relaxed, push the stick with your opposite hand, to raise your right / left arm out to the side of your body and then overhead. Raise your arm until you feel a stretch in your right / left shoulder, but before you have increased shoulder pain.  Try to avoid shrugging your right / left shoulder as your arm rises, by keeping your shoulder blade tucked down and toward your mid-back spine. Hold for __________ seconds.  Slowly return to the starting position. Repeat __________ times. Complete this exercise __________ times per day. ROM - Flexion, Active-Assisted  Lie on your back.   You may bend your knees for comfort.  Grasp a broomstick or cane so your hands are about shoulder width apart. Your right / left hand should grip the end of the stick, so that your hand is positioned "thumbs-up," as if you were about to shake hands.  Using your healthy arm to lead, raise your right / left arm overhead, until you feel a gentle stretch in your shoulder. Hold for __________ seconds.  Use the stick  to assist in returning your right / left arm to its starting position. Repeat __________ times. Complete this exercise __________ times per day.  ROM - Internal Rotation, Supine   Lie on your back on a firm surface. Place your right / left elbow about 60 degrees away from your side. Elevate your elbow with a folded towel, so that the elbow and shoulder are the same height.  Using a broomstick or cane and your strong arm, pull your right / left hand toward your body until you feel a gentle stretch, but no increase in your shoulder pain. Keep your shoulder and elbow in place throughout the exercise.  Hold for __________ seconds. Slowly return to the starting position. Repeat __________ times. Complete this exercise __________ times per day. STRETCH - Internal Rotation  Place your right / left hand behind your back, palm up.  Throw a towel or belt over your opposite shoulder. Grasp the towel with your right / left hand.  While keeping an upright posture, gently pull up on the towel, until you feel a stretch in the front of your right / left shoulder.  Avoid shrugging your right / left shoulder as your arm rises, by keeping your shoulder blade tucked down and toward your mid-back spine.  Hold for __________ seconds. Release the stretch, by lowering your healthy hand. Repeat __________ times. Complete this exercise __________ times per day. ROM - Internal Rotation   Using an underhand grip, grasp a stick behind your back with both hands.  While standing upright with good posture, slide the stick up your back until you feel a mild stretch in the front of your shoulder.  Hold for __________ seconds. Slowly return to your starting position. Repeat __________ times. Complete this exercise __________ times per day.  STRETCH - Posterior Shoulder Capsule   Stand or sit with good posture. Grasp your right / left elbow and draw it across your chest, keeping it at the same height as your  shoulder.  Pull your elbow, so your upper arm comes in closer to your chest. Pull until you feel a gentle stretch in the back of your shoulder.  Hold for __________ seconds. Repeat __________ times. Complete this exercise __________ times per day. STRENGTHENING EXERCISES - Impingement Syndrome (Rotator Cuff Tendinitis, Bursitis) These exercises may help you when beginning to rehabilitate your injury. They may resolve your symptoms with or without further involvement from your physician, physical therapist or athletic trainer. While completing these exercises, remember:  Muscles can gain both the endurance and the strength needed for everyday activities through controlled exercises.  Complete these exercises as instructed by your physician, physical therapist or athletic trainer. Increase the resistance and repetitions only as guided.  You may experience muscle soreness or fatigue, but the pain or discomfort you are trying to eliminate should never worsen during these exercises. If this pain does get worse, stop and make sure you are following the directions exactly. If the pain is still present after adjustments, discontinue the exercise until you can discuss   the trouble with your clinician.  During your recovery, avoid activity or exercises which involve actions that place your injured hand or elbow above your head or behind your back or head. These positions stress the tissues which you are trying to heal. STRENGTH - Scapular Depression and Adduction   With good posture, sit on a firm chair. Support your arms in front of you, with pillows, arm rests, or on a table top. Have your elbows in line with the sides of your body.  Gently draw your shoulder blades down and toward your mid-back spine. Gradually increase the tension, without tensing the muscles along the top of your shoulders and the back of your neck.  Hold for __________ seconds. Slowly release the tension and relax your muscles  completely before starting the next repetition.  After you have practiced this exercise, remove the arm support and complete the exercise in standing as well as sitting position. Repeat __________ times. Complete this exercise __________ times per day.  STRENGTH - Shoulder Abductors, Isometric  With good posture, stand or sit about 4-6 inches from a wall, with your right / left side facing the wall.  Bend your right / left elbow. Gently press your right / left elbow into the wall. Increase the pressure gradually, until you are pressing as hard as you can, without shrugging your shoulder or increasing any shoulder discomfort.  Hold for __________ seconds.  Release the tension slowly. Relax your shoulder muscles completely before you begin the next repetition. Repeat __________ times. Complete this exercise __________ times per day.  STRENGTH - External Rotators, Isometric  Keep your right / left elbow at your side and bend it 90 degrees.  Step into a door frame so that the outside of your right / left wrist can press against the door frame without your upper arm leaving your side.  Gently press your right / left wrist into the door frame, as if you were trying to swing the back of your hand away from your stomach. Gradually increase the tension, until you are pressing as hard as you can, without shrugging your shoulder or increasing any shoulder discomfort.  Hold for __________ seconds.  Release the tension slowly. Relax your shoulder muscles completely before you begin the next repetition. Repeat __________ times. Complete this exercise __________ times per day.  STRENGTH - Supraspinatus   Stand or sit with good posture. Grasp a __________ weight, or an exercise band or tubing, so that your hand is "thumbs-up," like you are shaking hands.  Slowly lift your right / left arm in a "V" away from your thigh, diagonally into the space between your side and straight ahead. Lift your hand to  shoulder height or as far as you can, without increasing any shoulder pain. At first, many people do not lift their hands above shoulder height.  Avoid shrugging your right / left shoulder as your arm rises, by keeping your shoulder blade tucked down and toward your mid-back spine.  Hold for __________ seconds. Control the descent of your hand, as you slowly return to your starting position. Repeat __________ times. Complete this exercise __________ times per day.  STRENGTH - External Rotators  Secure a rubber exercise band or tubing to a fixed object (table, pole) so that it is at the same height as your right / left elbow when you are standing or sitting on a firm surface.  Stand or sit so that the secured exercise band is at your uninjured side.  Bend   your right / left elbow 90 degrees. Place a folded towel or small pillow under your right / left arm, so that your elbow is a few inches away from your side.  Keeping the tension on the exercise band, pull it away from your body, as if pivoting on your elbow. Be sure to keep your body steady, so that the movement is coming only from your rotating shoulder.  Hold for __________ seconds. Release the tension in a controlled manner, as you return to the starting position. Repeat __________ times. Complete this exercise __________ times per day.  STRENGTH - Internal Rotators   Secure a rubber exercise band or tubing to a fixed object (table, pole) so that it is at the same height as your right / left elbow when you are standing or sitting on a firm surface.  Stand or sit so that the secured exercise band is at your right / left side.  Bend your elbow 90 degrees. Place a folded towel or small pillow under your right / left arm so that your elbow is a few inches away from your side.  Keeping the tension on the exercise band, pull it across your body, toward your stomach. Be sure to keep your body steady, so that the movement is coming only from  your rotating shoulder.  Hold for __________ seconds. Release the tension in a controlled manner, as you return to the starting position. Repeat __________ times. Complete this exercise __________ times per day.  STRENGTH - Scapular Protractors, Standing   Stand arms length away from a wall. Place your hands on the wall, keeping your elbows straight.  Begin by dropping your shoulder blades down and toward your mid-back spine.  To strengthen your protractors, keep your shoulder blades down, but slide them forward on your rib cage. It will feel as if you are lifting the back of your rib cage away from the wall. This is a subtle motion and can be challenging to complete. Ask your caregiver for further instruction, if you are not sure you are doing the exercise correctly.  Hold for __________ seconds. Slowly return to the starting position, resting the muscles completely before starting the next repetition. Repeat __________ times. Complete this exercise __________ times per day. STRENGTH - Scapular Protractors, Supine  Lie on your back on a firm surface. Extend your right / left arm straight into the air while holding a __________ weight in your hand.  Keeping your head and back in place, lift your shoulder off the floor.  Hold for __________ seconds. Slowly return to the starting position, and allow your muscles to relax completely before starting the next repetition. Repeat __________ times. Complete this exercise __________ times per day. STRENGTH - Scapular Protractors, Quadruped  Get onto your hands and knees, with your shoulders directly over your hands (or as close as you can be, comfortably).  Keeping your elbows locked, lift the back of your rib cage up into your shoulder blades, so your mid-back rounds out. Keep your neck muscles relaxed.  Hold this position for __________ seconds. Slowly return to the starting position and allow your muscles to relax completely before starting the  next repetition. Repeat __________ times. Complete this exercise __________ times per day.  STRENGTH - Scapular Retractors  Secure a rubber exercise band or tubing to a fixed object (table, pole), so that it is at the height of your shoulders when you are either standing, or sitting on a firm armless chair.  With a   palm down grip, grasp an end of the band in each hand. Straighten your elbows and lift your hands straight in front of you, at shoulder height. Step back, away from the secured end of the band, until it becomes tense.  Squeezing your shoulder blades together, draw your elbows back toward your sides, as you bend them. Keep your upper arms lifted away from your body throughout the exercise.  Hold for __________ seconds. Slowly ease the tension on the band, as you reverse the directions and return to the starting position. Repeat __________ times. Complete this exercise __________ times per day. STRENGTH - Shoulder Extensors   Secure a rubber exercise band or tubing to a fixed object (table, pole) so that it is at the height of your shoulders when you are either standing, or sitting on a firm armless chair.  With a thumbs-up grip, grasp an end of the band in each hand. Straighten your elbows and lift your hands straight in front of you, at shoulder height. Step back, away from the secured end of the band, until it becomes tense.  Squeezing your shoulder blades together, pull your hands down to the sides of your thighs. Do not allow your hands to go behind you.  Hold for __________ seconds. Slowly ease the tension on the band, as you reverse the directions and return to the starting position. Repeat __________ times. Complete this exercise __________ times per day.  STRENGTH - Scapular Retractors and External Rotators   Secure a rubber exercise band or tubing to a fixed object (table, pole) so that it is at the height as your shoulders, when you are either standing, or sitting on a  firm armless chair.  With a palm down grip, grasp an end of the band in each hand. Bend your elbows 90 degrees and lift your elbows to shoulder height, at your sides. Step back, away from the secured end of the band, until it becomes tense.  Squeezing your shoulder blades together, rotate your shoulders so that your upper arms and elbows remain stationary, but your fists travel upward to head height.  Hold for __________ seconds. Slowly ease the tension on the band, as you reverse the directions and return to the starting position. Repeat __________ times. Complete this exercise __________ times per day.  STRENGTH - Scapular Retractors and External Rotators, Rowing   Secure a rubber exercise band or tubing to a fixed object (table, pole) so that it is at the height of your shoulders, when you are either standing, or sitting on a firm armless chair.  With a palm down grip, grasp an end of the band in each hand. Straighten your elbows and lift your hands straight in front of you, at shoulder height. Step back, away from the secured end of the band, until it becomes tense.  Step 1: Squeeze your shoulder blades together. Bending your elbows, draw your hands to your chest, as if you are rowing a boat. At the end of this motion, your hands and elbow should be at shoulder height and your elbows should be out to your sides.  Step 2: Rotate your shoulders, to raise your hands above your head. Your forearms should be vertical and your upper arms should be horizontal.  Hold for __________ seconds. Slowly ease the tension on the band, as you reverse the directions and return to the starting position. Repeat __________ times. Complete this exercise __________ times per day.  STRENGTH - Scapular Depressors  Find a sturdy chair   without wheels, such as a dining room chair.  Keeping your feet on the floor, and your hands on the chair arms, lift your bottom up from the seat, and lock your elbows.  Keeping  your elbows straight, allow gravity to pull your body weight down. Your shoulders will rise toward your ears.  Raise your body against gravity by drawing your shoulder blades down your back, shortening the distance between your shoulders and ears. Although your feet should always maintain contact with the floor, your feet should progressively support less body weight, as you get stronger.  Hold for __________ seconds. In a controlled and slow manner, lower your body weight to begin the next repetition. Repeat __________ times. Complete this exercise __________ times per day.    This information is not intended to replace advice given to you by your health care provider. Make sure you discuss any questions you have with your health care provider.   Document Released: 12/20/2004 Document Revised: 01/10/2014 Document Reviewed: 04/03/2008 [employer] Interactive Patient Education 2016 [employer]   Low Back Strain With Rehab A strain is an injury in which a tendon or muscle is torn. The muscles and tendons of the lower back are vulnerable to strains. However, these muscles and tendons are very strong and require a great force to be injured. Strains are classified into three categories. Grade 1 strains cause pain, but the tendon is not lengthened. Grade 2 strains include a lengthened ligament, due to the ligament being stretched or partially ruptured. With grade 2 strains there is still function, although the function may be decreased. Grade 3 strains involve a complete tear of the tendon or muscle, and function is usually impaired. SYMPTOMS   Pain in the lower back.  Pain that affects one side more than the other.  Pain that gets worse with movement and may be felt in the hip, buttocks, or back of the thigh.  Muscle spasms of the muscles in the back.  Swelling along the muscles of the back.  Loss of strength of the back muscles.  Crackling sound (crepitation) when the muscles are  touched. CAUSES  Lower back strains occur when a force is placed on the muscles or tendons that is greater than they can handle. Common causes of injury include:  Prolonged overuse of the muscle-tendon units in the lower back, usually from incorrect posture.  A single violent injury or force applied to the back. RISK INCREASES WITH:  Sports that involve twisting forces on the spine or a lot of bending at the waist (football, rugby, weightlifting, bowling, golf, tennis, speed skating, racquetball, swimming, running, gymnastics, diving).  Poor strength and flexibility.  Failure to warm up properly before activity.  Family history of lower back pain or disk disorders.  Previous back injury or surgery (especially fusion).  Poor posture with lifting, especially heavy objects.  Prolonged sitting, especially with poor posture. PREVENTION   Learn and use proper posture when sitting or lifting (maintain proper posture when sitting, lift using the knees and legs, not at the waist).  Warm up and stretch properly before activity.  Allow for adequate recovery between workouts.  Maintain physical fitness:  Strength, flexibility, and endurance.  Cardiovascular fitness. PROGNOSIS  If treated properly, lower back strains usually heal within 6 weeks. RELATED COMPLICATIONS   Recurring symptoms, resulting in a chronic problem.  Chronic inflammation, scarring, and partial muscle-tendon tear.  Delayed healing or resolution of symptoms.  Prolonged disability. TREATMENT  Treatment first involves the use of   ice and medicine, to reduce pain and inflammation. The use of strengthening and stretching exercises may help reduce pain with activity. These exercises may be performed at home or with a therapist. Severe injuries may require referral to a therapist for further evaluation and treatment, such as ultrasound. Your caregiver may advise that you wear a back brace or corset, to help reduce pain  and discomfort. Often, prolonged bed rest results in greater harm then benefit. Corticosteroid injections may be recommended. However, these should be reserved for the most serious cases. It is important to avoid using your back when lifting objects. At night, sleep on your back on a firm mattress with a pillow placed under your knees. If non-surgical treatment is unsuccessful, surgery may be needed.  MEDICATION   If pain medicine is needed, nonsteroidal anti-inflammatory medicines (aspirin and ibuprofen), or other minor pain relievers (acetaminophen), are often advised.  Do not take pain medicine for 7 days before surgery.  Prescription pain relievers may be given, if your caregiver thinks they are needed. Use only as directed and only as much as you need.  Ointments applied to the skin may be helpful.  Corticosteroid injections may be given by your caregiver. These injections should be reserved for the most serious cases, because they may only be given a certain number of times. HEAT AND COLD  Cold treatment (icing) should be applied for 10 to 15 minutes every 2 to 3 hours for inflammation and pain, and immediately after activity that aggravates your symptoms. Use ice packs or an ice massage.  Heat treatment may be used before performing stretching and strengthening activities prescribed by your caregiver, physical therapist, or athletic trainer. Use a heat pack or a warm water soak. SEEK MEDICAL CARE IF:   Symptoms get worse or do not improve in 2 to 4 weeks, despite treatment.  You develop numbness, weakness, or loss of bowel or bladder function.  New, unexplained symptoms develop. (Drugs used in treatment may produce side effects.) EXERCISES  RANGE OF MOTION (ROM) AND STRETCHING EXERCISES - Low Back Strain Most people with lower back pain will find that their symptoms get worse with excessive bending forward (flexion) or arching at the lower back (extension). The exercises which will  help resolve your symptoms will focus on the opposite motion.  Your physician, physical therapist or athletic trainer will help you determine which exercises will be most helpful to resolve your lower back pain. Do not complete any exercises without first consulting with your caregiver. Discontinue any exercises which make your symptoms worse until you speak to your caregiver.  If you have pain, numbness or tingling which travels down into your buttocks, leg or foot, the goal of the therapy is for these symptoms to move closer to your back and eventually resolve. Sometimes, these leg symptoms will get better, but your lower back pain may worsen. This is typically an indication of progress in your rehabilitation. Be very alert to any changes in your symptoms and the activities in which you participated in the 24 hours prior to the change. Sharing this information with your caregiver will allow him/her to most efficiently treat your condition.  These exercises may help you when beginning to rehabilitate your injury. Your symptoms may resolve with or without further involvement from your physician, physical therapist or athletic trainer. While completing these exercises, remember:  Restoring tissue flexibility helps normal motion to return to the joints. This allows healthier, less painful movement and activity.  An effective stretch   should be held for at least 30 seconds.  A stretch should never be painful. You should only feel a gentle lengthening or release in the stretched tissue. FLEXION RANGE OF MOTION AND STRETCHING EXERCISES: STRETCH - Flexion, Single Knee to Chest   Lie on a firm bed or floor with both legs extended in front of you.  Keeping one leg in contact with the floor, bring your opposite knee to your chest. Hold your leg in place by either grabbing behind your thigh or at your knee.  Pull until you feel a gentle stretch in your lower back. Hold __________ seconds.  Slowly release  your grasp and repeat the exercise with the opposite side. Repeat __________ times. Complete this exercise __________ times per day.  STRETCH - Flexion, Double Knee to Chest   Lie on a firm bed or floor with both legs extended in front of you.  Keeping one leg in contact with the floor, bring your opposite knee to your chest.  Tense your stomach muscles to support your back and then lift your other knee to your chest. Hold your legs in place by either grabbing behind your thighs or at your knees.  Pull both knees toward your chest until you feel a gentle stretch in your lower back. Hold __________ seconds.  Tense your stomach muscles and slowly return one leg at a time to the floor. Repeat __________ times. Complete this exercise __________ times per day.  STRETCH - Low Trunk Rotation  Lie on a firm bed or floor. Keeping your legs in front of you, bend your knees so they are both pointed toward the ceiling and your feet are flat on the floor.  Extend your arms out to the side. This will stabilize your upper body by keeping your shoulders in contact with the floor.  Gently and slowly drop both knees together to one side until you feel a gentle stretch in your lower back. Hold for __________ seconds.  Tense your stomach muscles to support your lower back as you bring your knees back to the starting position. Repeat the exercise to the other side. Repeat __________ times. Complete this exercise __________ times per day  EXTENSION RANGE OF MOTION AND FLEXIBILITY EXERCISES: STRETCH - Extension, Prone on Elbows   Lie on your stomach on the floor, a bed will be too soft. Place your palms about shoulder width apart and at the height of your head.  Place your elbows under your shoulders. If this is too painful, stack pillows under your chest.  Allow your body to relax so that your hips drop lower and make contact more completely with the floor.  Hold this position for __________  seconds.  Slowly return to lying flat on the floor. Repeat __________ times. Complete this exercise __________ times per day.  RANGE OF MOTION - Extension, Prone Press Ups  Lie on your stomach on the floor, a bed will be too soft. Place your palms about shoulder width apart and at the height of your head.  Keeping your back as relaxed as possible, slowly straighten your elbows while keeping your hips on the floor. You may adjust the placement of your hands to maximize your comfort. As you gain motion, your hands will come more underneath your shoulders.  Hold this position __________ seconds.  Slowly return to lying flat on the floor. Repeat __________ times. Complete this exercise __________ times per day.  RANGE OF MOTION- Quadruped, Neutral Spine   Assume a hands and knees   position on a firm surface. Keep your hands under your shoulders and your knees under your hips. You may place padding under your knees for comfort.  Drop your head and point your tail bone toward the ground below you. This will round out your lower back like an angry cat. Hold this position for __________ seconds.  Slowly lift your head and release your tail bone so that your back sags into a large arch, like an old horse.  Hold this position for __________ seconds.  Repeat this until you feel limber in your lower back.  Now, find your "sweet spot." This will be the most comfortable position somewhere between the two previous positions. This is your neutral spine. Once you have found this position, tense your stomach muscles to support your lower back.  Hold this position for __________ seconds. Repeat __________ times. Complete this exercise __________ times per day.  STRENGTHENING EXERCISES - Low Back Strain These exercises may help you when beginning to rehabilitate your injury. These exercises should be done near your "sweet spot." This is the neutral, low-back arch, somewhere between fully rounded and fully  arched, that is your least painful position. When performed in this safe range of motion, these exercises can be used for people who have either a flexion or extension based injury. These exercises may resolve your symptoms with or without further involvement from your physician, physical therapist or athletic trainer. While completing these exercises, remember:   Muscles can gain both the endurance and the strength needed for everyday activities through controlled exercises.  Complete these exercises as instructed by your physician, physical therapist or athletic trainer. Increase the resistance and repetitions only as guided.  You may experience muscle soreness or fatigue, but the pain or discomfort you are trying to eliminate should never worsen during these exercises. If this pain does worsen, stop and make certain you are following the directions exactly. If the pain is still present after adjustments, discontinue the exercise until you can discuss the trouble with your caregiver. STRENGTHENING - Deep Abdominals, Pelvic Tilt  Lie on a firm bed or floor. Keeping your legs in front of you, bend your knees so they are both pointed toward the ceiling and your feet are flat on the floor.  Tense your lower abdominal muscles to press your lower back into the floor. This motion will rotate your pelvis so that your tail bone is scooping upwards rather than pointing at your feet or into the floor.  With a gentle tension and even breathing, hold this position for __________ seconds. Repeat __________ times. Complete this exercise __________ times per day.  STRENGTHENING - Abdominals, Crunches   Lie on a firm bed or floor. Keeping your legs in front of you, bend your knees so they are both pointed toward the ceiling and your feet are flat on the floor. Cross your arms over your chest.  Slightly tip your chin down without bending your neck.  Tense your abdominals and slowly lift your trunk high enough  to just clear your shoulder blades. Lifting higher can put excessive stress on the lower back and does not further strengthen your abdominal muscles.  Control your return to the starting position. Repeat __________ times. Complete this exercise __________ times per day.  STRENGTHENING - Quadruped, Opposite UE/LE Lift   Assume a hands and knees position on a firm surface. Keep your hands under your shoulders and your knees under your hips. You may place padding under your knees for comfort.    Find your neutral spine and gently tense your abdominal muscles so that you can maintain this position. Your shoulders and hips should form a rectangle that is parallel with the floor and is not twisted.  Keeping your trunk steady, lift your right hand no higher than your shoulder and then your left leg no higher than your hip. Make sure you are not holding your breath. Hold this position __________ seconds.  Continuing to keep your abdominal muscles tense and your back steady, slowly return to your starting position. Repeat with the opposite arm and leg. Repeat __________ times. Complete this exercise __________ times per day.  STRENGTHENING - Lower Abdominals, Double Knee Lift  Lie on a firm bed or floor. Keeping your legs in front of you, bend your knees so they are both pointed toward the ceiling and your feet are flat on the floor.  Tense your abdominal muscles to brace your lower back and slowly lift both of your knees until they come over your hips. Be certain not to hold your breath.  Hold __________ seconds. Using your abdominal muscles, return to the starting position in a slow and controlled manner. Repeat __________ times. Complete this exercise __________ times per day.  POSTURE AND BODY MECHANICS CONSIDERATIONS - Low Back Strain Keeping correct posture when sitting, standing or completing your activities will reduce the stress put on different body tissues, allowing injured tissues a chance to  heal and limiting painful experiences. The following are general guidelines for improved posture. Your physician or physical therapist will provide you with any instructions specific to your needs. While reading these guidelines, remember:  The exercises prescribed by your provider will help you have the flexibility and strength to maintain correct postures.  The correct posture provides the best environment for your joints to work. All of your joints have less wear and tear when properly supported by a spine with good posture. This means you will experience a healthier, less painful body.  Correct posture must be practiced with all of your activities, especially prolonged sitting and standing. Correct posture is as important when doing repetitive low-stress activities (typing) as it is when doing a single heavy-load activity (lifting). RESTING POSITIONS Consider which positions are most painful for you when choosing a resting position. If you have pain with flexion-based activities (sitting, bending, stooping, squatting), choose a position that allows you to rest in a less flexed posture. You would want to avoid curling into a fetal position on your side. If your pain worsens with extension-based activities (prolonged standing, working overhead), avoid resting in an extended position such as sleeping on your stomach. Most people will find more comfort when they rest with their spine in a more neutral position, neither too rounded nor too arched. Lying on a non-sagging bed on your side with a pillow between your knees, or on your back with a pillow under your knees will often provide some relief. Keep in mind, being in any one position for a prolonged period of time, no matter how correct your posture, can still lead to stiffness. PROPER SITTING POSTURE In order to minimize stress and discomfort on your spine, you must sit with correct posture. Sitting with good posture should be effortless for a healthy  body. Returning to good posture is a gradual process. Many people can work toward this most comfortably by using various supports until they have the flexibility and strength to maintain this posture on their own. When sitting with proper posture, your ears will fall   over your shoulders and your shoulders will fall over your hips. You should use the back of the chair to support your upper back. Your lower back will be in a neutral position, just slightly arched. You may place a small pillow or folded towel at the base of your lower back for support.  When working at a desk, create an environment that supports good, upright posture. Without extra support, muscles tire, which leads to excessive strain on joints and other tissues. Keep these recommendations in mind: CHAIR:  A chair should be able to slide under your desk when your back makes contact with the back of the chair. This allows you to work closely.  The chair's height should allow your eyes to be level with the upper part of your monitor and your hands to be slightly lower than your elbows. BODY POSITION  Your feet should make contact with the floor. If this is not possible, use a foot rest.  Keep your ears over your shoulders. This will reduce stress on your neck and lower back. INCORRECT SITTING POSTURES  If you are feeling tired and unable to assume a healthy sitting posture, do not slouch or slump. This puts excessive strain on your back tissues, causing more damage and pain. Healthier options include:  Using more support, like a lumbar pillow.  Switching tasks to something that requires you to be upright or walking.  Talking a brief walk.  Lying down to rest in a neutral-spine position. PROLONGED STANDING WHILE SLIGHTLY LEANING FORWARD  When completing a task that requires you to lean forward while standing in one place for a long time, place either foot up on a stationary 2-4 inch high object to help maintain the best posture.  When both feet are on the ground, the lower back tends to lose its slight inward curve. If this curve flattens (or becomes too large), then the back and your other joints will experience too much stress, tire more quickly, and can cause pain. CORRECT STANDING POSTURES Proper standing posture should be assumed with all daily activities, even if they only take a few moments, like when brushing your teeth. As in sitting, your ears should fall over your shoulders and your shoulders should fall over your hips. You should keep a slight tension in your abdominal muscles to brace your spine. Your tailbone should point down to the ground, not behind your body, resulting in an over-extended swayback posture.  INCORRECT STANDING POSTURES  Common incorrect standing postures include a forward head, locked knees and/or an excessive swayback. WALKING Walk with an upright posture. Your ears, shoulders and hips should all line-up. PROLONGED ACTIVITY IN A FLEXED POSITION When completing a task that requires you to bend forward at your waist or lean over a low surface, try to find a way to stabilize 3 out of 4 of your limbs. You can place a hand or elbow on your thigh or rest a knee on the surface you are reaching across. This will provide you more stability so that your muscles do not fatigue as quickly. By keeping your knees relaxed, or slightly bent, you will also reduce stress across your lower back. CORRECT LIFTING TECHNIQUES DO :   Assume a wide stance. This will provide you more stability and the opportunity to get as close as possible to the object which you are lifting.  Tense your abdominals to brace your spine. Bend at the knees and hips. Keeping your back locked in a neutral-spine position, lift   using your leg muscles. Lift with your legs, keeping your back straight.  Test the weight of unknown objects before attempting to lift them.  Try to keep your elbows locked down at your sides in order get the best  strength from your shoulders when carrying an object.  Always ask for help when lifting heavy or awkward objects. INCORRECT LIFTING TECHNIQUES DO NOT:   Lock your knees when lifting, even if it is a small object.  Bend and twist. Pivot at your feet or move your feet when needing to change directions.  Assume that you can safely pick up even a paper clip without proper posture.   This information is not intended to replace advice given to you by your health care provider. Make sure you discuss any questions you have with your health care provider.   Document Released: 12/20/2004 Document Revised: 01/10/2014 Document Reviewed: 04/03/2008 [employer] Education 2016 [employer]  
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
## 2015-10-01 ENCOUNTER — Ambulatory Visit (INDEPENDENT_AMBULATORY_CARE_PROVIDER_SITE_OTHER): Payer: Self-pay | Admitting: Family Medicine

## 2015-10-01 ENCOUNTER — Encounter: Payer: Self-pay | Admitting: Family Medicine

## 2015-10-01 DIAGNOSIS — F32A Depression, unspecified: Secondary | ICD-10-CM

## 2015-10-01 DIAGNOSIS — F419 Anxiety disorder, unspecified: Secondary | ICD-10-CM

## 2015-10-01 DIAGNOSIS — G894 Chronic pain syndrome: Secondary | ICD-10-CM

## 2015-10-01 DIAGNOSIS — F329 Major depressive disorder, single episode, unspecified: Secondary | ICD-10-CM
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
## 2015-10-01 NOTE — Assessment & Plan Note (Signed)
Depression noted per patient report and recent PHQ 9. No SI. I discussed medications though she wants to hold off on this at this time. Also offered to have her see a therapist though with her upcoming job loss she will not have insurance. She'll think about this and let us know.

## 2015-10-06 LAB — TOXASSURE SELECT,+ANTIDEPR,UR

## 2015-10-07 NOTE — Progress Notes (Signed)
Urine drug screen for this encounter is consistent for prescribed medication 

## 2015-11-10 ENCOUNTER — Encounter: Payer: BLUE CROSS/BLUE SHIELD | Admitting: Physical Medicine & Rehabilitation

## 2015-12-30 ENCOUNTER — Ambulatory Visit: Payer: BLUE CROSS/BLUE SHIELD | Admitting: Family Medicine

## 2016-01-22 ENCOUNTER — Ambulatory Visit (INDEPENDENT_AMBULATORY_CARE_PROVIDER_SITE_OTHER): Payer: Self-pay | Admitting: Family Medicine

## 2016-01-22 ENCOUNTER — Encounter: Payer: Self-pay | Admitting: Family Medicine

## 2016-01-22 DIAGNOSIS — M7582 Other shoulder lesions, left shoulder: Secondary | ICD-10-CM

## 2016-01-22 DIAGNOSIS — G8929 Other chronic pain: Secondary | ICD-10-CM

## 2016-01-22 DIAGNOSIS — M5441 Lumbago with sciatica, right side: Secondary | ICD-10-CM
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
## 2016-01-22 NOTE — Patient Instructions (Signed)
Rotator Cuff Tendinitis Rotator cuff tendinitis is inflammation of the tough, cord-like bands that connect muscle to bone (tendons) in your rotator cuff. Your rotator cuff is the collection of all the muscles and tendons that connect your arm to your shoulder. Your rotator cuff holds the head of your upper arm bone (humerus) in the cup (fossa) of your shoulder blade (scapula). CAUSES Rotator cuff tendinitis is usually caused by overusing the joint involved.  SIGNS AND SYMPTOMS  Deep ache in the shoulder also felt on the outside upper arm over the shoulder muscle.  Point tenderness over the area that is injured.  Pain comes on gradually and becomes worse with lifting the arm to the side (abduction) or turning it inward (internal rotation).  May lead to a chronic tear: When a rotator cuff tendon becomes inflamed, it runs the risk of losing its blood supply, causing some tendon fibers to die. This increases the risk that the tendon can fray and partially or completely tear. DIAGNOSIS Rotator cuff tendinitis is diagnosed by taking a medical history, performing a physical exam, and reviewing results of imaging exams. The medical history is useful to help determine the type of rotator cuff injury. The physical exam will include looking at the injured shoulder, feeling the injured area, and watching you do range-of-motion exercises. X-ray exams are typically done to rule out other causes of shoulder pain, such as fractures. MRI is the imaging exam usually used for significant shoulder injuries. Sometimes a dye study called CT arthrogram is done, but it is not as widely used as MRI. In some institutions, special ultrasound tests may also be used to aid in the diagnosis. TREATMENT  Less Severe Cases   Use of a sling to rest the shoulder for a short period of time. Prolonged use of the sling can cause stiffness, weakness, and loss of motion of the shoulder joint.  Anti-inflammatory medicines, such as  ibuprofen or naproxen sodium, may be prescribed. More Severe Cases   Physical therapy.  Use of steroid injections into the shoulder joint.  Surgery. HOME CARE INSTRUCTIONS   Use a sling or splint until the pain decreases. Prolonged use of the sling can cause stiffness, weakness, and loss of motion of the shoulder joint.  Apply ice to the injured area:  Put ice in a plastic bag.  Place a towel between your skin and the bag.  Leave the ice on for 20 minutes, 2-3 times a day.  Try to avoid use other than gentle range of motion while your shoulder is painful. Use the shoulder and exercise only as directed by your health care provider. Stop exercises or range of motion if pain or discomfort increases, unless directed otherwise by your health care provider.  Only take over-the-counter or prescription medicines for pain, discomfort, or fever as directed by your health care provider.  If you were given a shoulder sling and straps (immobilizer), do not remove it except as directed, or until you see a health care provider for a follow-up exam. If you need to remove it, move your arm as little as possible or as directed.  You may want to sleep on several pillows at night to lessen swelling and pain. SEEK IMMEDIATE MEDICAL CARE IF:   Your shoulder pain increases or new pain develops in your arm, hand, or fingers and is not relieved with medicines.  You have new, unexplained symptoms, especially increased numbness in the hands or loss of strength.  You develop any worsening of the  problems that brought you in for care.  Your arm, hand, or fingers are numb or tingling.  Your arm, hand, or fingers are swollen, painful, or turn white or blue. MAKE SURE YOU:  Understand these instructions.  Will watch your condition.  Will get help right away if you are not doing well or get worse. This information is not intended to replace advice given to you by your health care provider. Make sure you  discuss any questions you have with your health care provider. Document Released: 03/12/2003 Document Revised: 01/10/2014 Document Reviewed: 08/01/2012 Elsevier Interactive Patient Education  2017 [employer].   Shoulder Exercises Ask your health care provider which exercises are safe for you. Do exercises exactly as told by your health care provider and adjust them as directed. It is normal to feel mild stretching, pulling, tightness, or discomfort as you do these exercises, but you should stop right away if you feel sudden pain or your pain gets worse.Do not begin these exercises until told by your health care provider. RANGE OF MOTION EXERCISES  These exercises warm up your muscles and joints and improve the movement and flexibility of your shoulder. These exercises also help to relieve pain, numbness, and tingling. These exercises involve stretching your injured shoulder directly. Exercise A: Pendulum  1. Stand near a wall or a surface that you can hold onto for balance. 2. Bend at the waist and let your left / right arm hang straight down. Use your other arm to support you. Keep your back straight and do not lock your knees. 3. Relax your left / right arm and shoulder muscles, and move your hips and your trunk so your left / right arm swings freely. Your arm should swing because of the motion of your body, not because you are using your arm or shoulder muscles. 4. Keep moving your body so your arm swings in the following directions, as told by your health care provider:  Side to side.  Forward and backward.  In clockwise and counterclockwise circles. 5. Continue each motion for __________ seconds, or for as long as told by your health care provider. 6. Slowly return to the starting position. Repeat __________ times. Complete this exercise __________ times a day. Exercise B:Flexion, Standing  1. Stand and hold a broomstick, a cane, or a similar object. Place your hands a little more  than shoulder-width apart on the object. Your left / right hand should be palm-up, and your other hand should be palm-down. 2. Keep your elbow straight and keep your shoulder muscles relaxed. Push the stick down with your healthy arm to raise your left / right arm in front of your body, and then over your head until you feel a stretch in your shoulder.  Avoid shrugging your shoulder while you raise your arm. Keep your shoulder blade tucked down toward the middle of your back. 3. Hold for __________ seconds. 4. Slowly return to the starting position. Repeat __________ times. Complete this exercise __________ times a day. Exercise C: Abduction, Standing 1. Stand and hold a broomstick, a cane, or a similar object. Place your hands a little more than shoulder-width apart on the object. Your left / right hand should be palm-up, and your other hand should be palm-down. 2. While keeping your elbow straight and your shoulder muscles relaxed, push the stick across your body toward your left / right side. Raise your left / right arm to the side of your body and then over your head until  you feel a stretch in your shoulder.  Do not raise your arm above shoulder height, unless your health care provider tells you to do that.  Avoid shrugging your shoulder while you raise your arm. Keep your shoulder blade tucked down toward the middle of your back. 3. Hold for __________ seconds. 4. Slowly return to the starting position. Repeat __________ times. Complete this exercise __________ times a day. Exercise D:Internal Rotation  1. Place your left / right hand behind your back, palm-up. 2. Use your other hand to dangle an exercise band, a towel, or a similar object over your shoulder. Grasp the band with your left / right hand so you are holding onto both ends. 3. Gently pull up on the band until you feel a stretch in the front of your left / right shoulder.  Avoid shrugging your shoulder while you raise your  arm. Keep your shoulder blade tucked down toward the middle of your back. 4. Hold for __________ seconds. 5. Release the stretch by letting go of the band and lowering your hands. Repeat __________ times. Complete this exercise __________ times a day. STRETCHING EXERCISES  These exercises warm up your muscles and joints and improve the movement and flexibility of your shoulder. These exercises also help to relieve pain, numbness, and tingling. These exercises are done using your healthy shoulder to help stretch the muscles of your injured shoulder. Exercise E: Warehouse manager (External Rotation and Abduction)  1. Stand in a doorway with one of your feet slightly in front of the other. This is called a staggered stance. If you cannot reach your forearms to the door frame, stand facing a corner of a room. 2. Choose one of the following positions as told by your health care provider:  Place your hands and forearms on the door frame above your head.  Place your hands and forearms on the door frame at the height of your head.  Place your hands on the door frame at the height of your elbows. 3. Slowly move your weight onto your front foot until you feel a stretch across your chest and in the front of your shoulders. Keep your head and chest upright and keep your abdominal muscles tight. 4. Hold for __________ seconds. 5. To release the stretch, shift your weight to your back foot. Repeat __________ times. Complete this stretch __________ times a day. Exercise F:Extension, Standing 1. Stand and hold a broomstick, a cane, or a similar object behind your back.  Your hands should be a little wider than shoulder-width apart.  Your palms should face away from your back. 2. Keeping your elbows straight and keeping your shoulder muscles relaxed, move the stick away from your body until you feel a stretch in your shoulder.  Avoid shrugging your shoulders while you move the stick. Keep your shoulder blade  tucked down toward the middle of your back. 3. Hold for __________ seconds. 4. Slowly return to the starting position. Repeat __________ times. Complete this exercise __________ times a day. STRENGTHENING EXERCISES  These exercises build strength and endurance in your shoulder. Endurance is the ability to use your muscles for a long time, even after they get tired. Exercise G:External Rotation  1. Sit in a stable chair without armrests. 2. Secure an exercise band at elbow height on your left / right side. 3. Place a soft object, such as a folded towel or a small pillow, between your left / right upper arm and your body to move your elbow  a few inches away (about 10 cm) from your side. 4. Hold the end of the band so it is tight and there is no slack. 5. Keeping your elbow pressed against the soft object, move your left / right forearm out, away from your abdomen. Keep your body steady so only your forearm moves. 6. Hold for __________ seconds. 7. Slowly return to the starting position. Repeat __________ times. Complete this exercise __________ times a day. Exercise H:Shoulder Abduction  1. Sit in a stable chair without armrests, or stand. 2. Hold a __________ weight in your left / right hand, or hold an exercise band with both hands. 3. Start with your arms straight down and your left / right palm facing in, toward your body. 4. Slowly lift your left / right hand out to your side. Do not lift your hand above shoulder height unless your health care provider tells you that this is safe.  Keep your arms straight.  Avoid shrugging your shoulder while you do this movement. Keep your shoulder blade tucked down toward the middle of your back. 5. Hold for __________ seconds. 6. Slowly lower your arm, and return to the starting position. Repeat __________ times. Complete this exercise __________ times a day. Exercise I:Shoulder Extension 1. Sit in a stable chair without armrests, or  stand. 2. Secure an exercise band to a stable object in front of you where it is at shoulder height. 3. Hold one end of the exercise band in each hand. Your palms should face each other. 4. Straighten your elbows and lift your hands up to shoulder height. 5. Step back, away from the secured end of the exercise band, until the band is tight and there is no slack. 6. Squeeze your shoulder blades together as you pull your hands down to the sides of your thighs. Stop when your hands are straight down by your sides. Do not let your hands go behind your body. 7. Hold for __________ seconds. 8. Slowly return to the starting position. Repeat __________ times. Complete this exercise __________ times a day. Exercise J:Standing Shoulder Row 1. Sit in a stable chair without armrests, or stand. 2. Secure an exercise band to a stable object in front of you so it is at waist height. 3. Hold one end of the exercise band in each hand. Your palms should be in a thumbs-up position. 4. Bend each of your elbows to an "L" shape (about 90 degrees) and keep your upper arms at your sides. 5. Step back until the band is tight and there is no slack. 6. Slowly pull your elbows back behind you. 7. Hold for __________ seconds. 8. Slowly return to the starting position. Repeat __________ times. Complete this exercise __________ times a day. Exercise K:Shoulder Press-Ups  1. Sit in a stable chair that has armrests. Sit upright, with your feet flat on the floor. 2. Put your hands on the armrests so your elbows are bent and your fingers are pointing forward. Your hands should be about even with the sides of your body. 3. Push down on the armrests and use your arms to lift yourself off of the chair. Straighten your elbows and lift yourself up as much as you comfortably can.  Move your shoulder blades down, and avoid letting your shoulders move up toward your ears.  Keep your feet on the ground. As you get stronger, your  feet should support less of your body weight as you lift yourself up. 4. Hold for __________ seconds.  5. Slowly lower yourself back into the chair. Repeat __________ times. Complete this exercise __________ times a day. Exercise L: Wall Push-Ups  1. Stand so you are facing a stable wall. Your feet should be about one arm-length away from the wall. 2. Lean forward and place your palms on the wall at shoulder height. 3. Keep your feet flat on the floor as you bend your elbows and lean forward toward the wall. 4. Hold for __________ seconds. 5. Straighten your elbows to push yourself back to the starting position. Repeat __________ times. Complete this exercise __________ times a day. This information is not intended to replace advice given to you by your health care provider. Make sure you discuss any questions you have with your health care provider. Document Released: 11/03/2004 Document Revised: 09/14/2015 Document Reviewed: 08/31/2014 Elsevier Interactive Patient Education  2017 [employer].

## 2016-01-22 NOTE — Assessment & Plan Note (Signed)
Quite a bit improved from previously. Still with some mild low back discomfort. Right lateral thigh numbness is somewhat improved from previously. She'll continue to monitor. Continue gabapentin. She'll let us know if she is taking Robaxin.

## 2016-01-22 NOTE — Progress Notes (Signed)
Pre visit review using our clinic review tool, if applicable. No additional management support is needed unless otherwise documented below in the visit note. 

## 2016-01-22 NOTE — Assessment & Plan Note (Signed)
History and exam consistent with rotator cuff tendinitis or impingement. Discussed exercises with patient. Discussed that we could do an injection though she wanted to defer this at this time. If not improving with exercises over the next month she'll contact us and we'll follow-up on this.

## 2016-02-12 ENCOUNTER — Other Ambulatory Visit: Payer: Self-pay | Admitting: Family Medicine

## 2016-02-12 MED ORDER — GABAPENTIN 300 MG PO CAPS: ORAL_CAPSULE | ORAL | 1 refills | Status: DC

## 2016-02-12 NOTE — Telephone Encounter (Signed)
Last filled 07/31/15 90 1rf last OV 01/22/16

## 2016-02-12 NOTE — Telephone Encounter (Signed)
Pt called and is requesting a refill on her gabapentin (NEURONTIN) 300 MG capsule. She is out of medication. Please advise, thank you!  Call pt @ [phone]  Pharmacy - [id number] - [place], [address]

## 2016-03-30 ENCOUNTER — Ambulatory Visit (INDEPENDENT_AMBULATORY_CARE_PROVIDER_SITE_OTHER): Payer: Self-pay | Admitting: Family Medicine

## 2016-03-30 ENCOUNTER — Ambulatory Visit (INDEPENDENT_AMBULATORY_CARE_PROVIDER_SITE_OTHER): Payer: Self-pay

## 2016-03-30 ENCOUNTER — Encounter: Payer: Self-pay | Admitting: Family Medicine

## 2016-03-30 DIAGNOSIS — G8929 Other chronic pain: Secondary | ICD-10-CM

## 2016-03-30 DIAGNOSIS — M25512 Pain in left shoulder: Secondary | ICD-10-CM

## 2016-03-30 MED ORDER — GABAPENTIN 300 MG PO CAPS: ORAL_CAPSULE | ORAL | 1 refills | Status: DC

## 2016-03-30 MED ORDER — METHOCARBAMOL 500 MG PO TABS: 500.0000 mg | ORAL_TABLET | Freq: Four times a day (QID) | ORAL | 2 refills | Status: DC | PRN

## 2016-03-30 MED ORDER — DICLOFENAC SODIUM 75 MG PO TBEC: 75.0000 mg | DELAYED_RELEASE_TABLET | Freq: Two times a day (BID) | ORAL | 3 refills | Status: DC
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
## 2016-03-30 NOTE — Progress Notes (Signed)
Pre visit review using our clinic review tool, if applicable. No additional management support is needed unless otherwise documented below in the visit note. 

## 2016-03-30 NOTE — Patient Instructions (Addendum)
Nice to see you. We will obtain x-rays of her neck and left shoulder. We will determine the next step in management once these return. I have refilled her diclofenac and gabapentin. I've also refilled her muscle relaxer. You can try these.  We'll start you back on the gabapentin. Please take 300 mg at night once daily for 3-4 days and see how you reacts. If you are not excessively tired you can increase to 300 mg twice daily for 3-4 days and if you're not excessively tired with this and do not have any adverse effects you can increase to 300 mg 3 times daily.  If you develop numbness, weakness, worsening pain, or any new or changing symptoms please seek medical attention immediately.
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
## 2016-04-05 ENCOUNTER — Other Ambulatory Visit: Payer: Self-pay | Admitting: Family Medicine

## 2016-04-05 DIAGNOSIS — G8929 Other chronic pain: Secondary | ICD-10-CM

## 2016-04-05 DIAGNOSIS — M25512 Pain in left shoulder: Principal | ICD-10-CM

## 2016-04-19 ENCOUNTER — Ambulatory Visit: Payer: Self-pay

## 2016-04-19 ENCOUNTER — Ambulatory Visit (INDEPENDENT_AMBULATORY_CARE_PROVIDER_SITE_OTHER): Payer: 59 | Admitting: Sports Medicine

## 2016-04-19 ENCOUNTER — Encounter: Payer: Self-pay | Admitting: Sports Medicine

## 2016-04-19 VITALS — BP 130/88 | HR 90 | Ht 66.0 in | Wt 219.4 lb

## 2016-04-19 DIAGNOSIS — G8929 Other chronic pain: Secondary | ICD-10-CM | POA: Diagnosis not present

## 2016-04-19 DIAGNOSIS — M25512 Pain in left shoulder: Secondary | ICD-10-CM | POA: Diagnosis not present

## 2016-04-19 DIAGNOSIS — G894 Chronic pain syndrome: Secondary | ICD-10-CM

## 2016-04-19 DIAGNOSIS — M47812 Spondylosis without myelopathy or radiculopathy, cervical region: Secondary | ICD-10-CM | POA: Diagnosis not present

## 2016-04-19 DIAGNOSIS — M7582 Other shoulder lesions, left shoulder: Secondary | ICD-10-CM | POA: Diagnosis not present
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
## 2016-04-20 NOTE — Assessment & Plan Note (Signed)
Ultrasound is concerning for potential's supraspinatus tear a potential supraspinatus tear.  If any lack of improvement MRI will be recommended. She undoubtedly has adhesive capsulitis and serial intra-articular injections large-volume will be beneficial.  Follow-up in 6 weeks to consider this.
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
## 2016-04-20 NOTE — Assessment & Plan Note (Signed)
Symptoms do seem to be more intrinsic to the shoulder at this time with less radicular problems however neurogenic component of adhesive capsulitis is possible.  Can consider amitriptyline if persistent ongoing symptoms.  See above notes.

## 2016-05-04 ENCOUNTER — Encounter: Payer: Self-pay | Admitting: Family Medicine

## 2016-05-04 ENCOUNTER — Ambulatory Visit (INDEPENDENT_AMBULATORY_CARE_PROVIDER_SITE_OTHER): Payer: Self-pay | Admitting: Family Medicine

## 2016-05-04 VITALS — BP 130/90 | HR 87 | Temp 98.2°F | Wt 217.0 lb

## 2016-05-04 DIAGNOSIS — F419 Anxiety disorder, unspecified: Secondary | ICD-10-CM

## 2016-05-04 DIAGNOSIS — F32A Depression, unspecified: Secondary | ICD-10-CM | POA: Insufficient documentation

## 2016-05-04 DIAGNOSIS — F321 Major depressive disorder, single episode, moderate: Secondary | ICD-10-CM

## 2016-05-04 DIAGNOSIS — G44229 Chronic tension-type headache, not intractable: Secondary | ICD-10-CM

## 2016-05-04 DIAGNOSIS — M7582 Other shoulder lesions, left shoulder: Secondary | ICD-10-CM

## 2016-05-04 DIAGNOSIS — F329 Major depressive disorder, single episode, unspecified: Secondary | ICD-10-CM

## 2016-05-04 MED ORDER — NORTRIPTYLINE HCL 25 MG PO CAPS: 25.0000 mg | ORAL_CAPSULE | Freq: Every day | ORAL | 1 refills | Status: DC
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
## 2016-05-04 NOTE — Progress Notes (Signed)
Pre visit review using our clinic review tool, if applicable. No additional management support is needed unless otherwise documented below in the visit note. 

## 2016-05-04 NOTE — Assessment & Plan Note (Signed)
Not overly improved after injection. Encouraged her to do physical therapy and to follow up with sports medicine.

## 2016-05-04 NOTE — Assessment & Plan Note (Signed)
Patient with chronic intermittent headaches. Seen for similar last year. CT scan at that time reassuring. Neurologically intact. Headaches could be consistent with tension headaches or migraine headaches. Given her depression and anxiety and chronic pain we will try treatment with nortriptyline after discussing potential side effects. I think physical therapy will be beneficial to help with her tension as well. She is given return precautions.

## 2016-05-04 NOTE — Patient Instructions (Signed)
Nice to see you. We are going to refer you to psychology for therapy. We will start you on nortriptyline to help with depression and anxiety and also to potentially help with your chronic pain and tension headaches. If you develop worsening headaches or you develop numbness or weakness or vision changes please seek medical attention immediately. If you develop thoughts of harming your self or others please seek medical attention immediately.
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
## 2016-05-11 ENCOUNTER — Ambulatory Visit: Payer: 59 | Attending: Sports Medicine | Admitting: Physical Therapy

## 2016-05-11 DIAGNOSIS — M542 Cervicalgia: Secondary | ICD-10-CM | POA: Insufficient documentation

## 2016-05-11 DIAGNOSIS — M6281 Muscle weakness (generalized): Secondary | ICD-10-CM | POA: Insufficient documentation

## 2016-05-11 DIAGNOSIS — M79622 Pain in left upper arm: Secondary | ICD-10-CM | POA: Insufficient documentation

## 2016-05-11 NOTE — Patient Instructions (Addendum)
Grip Strength  R 60 56 55  L  21 29 29   R flexion to 150  L flexion to 52 degrees   R abduction to 145  L abduction to 52 (painful)   MMT  IR - 5/5  ER - 4/5  Extension - 4/5  Abduction - 3+/5 (painful)   On R 5/5 for all and no pain (very minimal)   L Rotation - 26 degrees  R rotation - 43 degrees   Cervical flexion (deviates to R, painful, limited 25%) Cervical extension limited 50% (very painful, some blurry vision after)  Denies any difficulty swallowing-
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
## 2016-05-16 ENCOUNTER — Ambulatory Visit: Payer: 59 | Admitting: Physical Therapy

## 2016-05-16 ENCOUNTER — Encounter: Payer: Self-pay | Admitting: Physical Therapy

## 2016-05-16 DIAGNOSIS — M79622 Pain in left upper arm: Secondary | ICD-10-CM

## 2016-05-16 DIAGNOSIS — M6281 Muscle weakness (generalized): Secondary | ICD-10-CM

## 2016-05-16 DIAGNOSIS — M542 Cervicalgia: Secondary | ICD-10-CM
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
## 2016-05-19 ENCOUNTER — Ambulatory Visit: Payer: 59 | Admitting: Physical Therapy

## 2016-05-19 DIAGNOSIS — M542 Cervicalgia: Secondary | ICD-10-CM

## 2016-05-19 DIAGNOSIS — M6281 Muscle weakness (generalized): Secondary | ICD-10-CM

## 2016-05-19 DIAGNOSIS — M79622 Pain in left upper arm: Secondary | ICD-10-CM
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
## 2016-05-19 NOTE — Patient Instructions (Addendum)
AROM to L shoulder into flexion to 70 degrees   CPAs x 15" x 2 bouts from T7 to C2   Soft tissue mobilization to infraspinatus, upper trapezius, rhomboids with taut tender bands located in each   AROM in flexion to 96 degrees   Soft tissue mobilization to long head of biceps and lateral portion of deltoid  AROM to 104 degrees of flexion   Supine AAROM with PVC to 90-100 degrees x 15  Seated rows x 12, x 8 with bilateral UEs at 15# (mild-moderate discomfort but tolerable)

## 2016-05-23 ENCOUNTER — Ambulatory Visit: Payer: 59 | Admitting: Physical Therapy

## 2016-05-23 DIAGNOSIS — M542 Cervicalgia: Secondary | ICD-10-CM

## 2016-05-23 DIAGNOSIS — M6281 Muscle weakness (generalized): Secondary | ICD-10-CM

## 2016-05-23 DIAGNOSIS — M79622 Pain in left upper arm: Secondary | ICD-10-CM

## 2016-05-23 NOTE — Patient Instructions (Signed)
AROM to 88 degrees  CPAs to T7 through C2 -- 3 bouts x 15-30" at each level (most discomfort in lower thoracic spine today)   Soft tissue mobilization over rhomboids   High volt stim to 75V to posterior and lateral deltoid  While performing AAROM in supine with dowel x 15 repetitions .completed 2 sets (reports pain is over long head of biceps area into the muscle belly of the biceps.
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
## 2016-05-26 ENCOUNTER — Ambulatory Visit: Payer: 59 | Admitting: Physical Therapy

## 2016-05-26 DIAGNOSIS — M79622 Pain in left upper arm: Secondary | ICD-10-CM

## 2016-05-26 DIAGNOSIS — M6281 Muscle weakness (generalized): Secondary | ICD-10-CM

## 2016-05-26 DIAGNOSIS — M542 Cervicalgia: Secondary | ICD-10-CM

## 2016-05-26 NOTE — Patient Instructions (Signed)
Initial AROM to 102  On wedge - 2 sets   Distraction  PROM ER   PROM ER seated   AAROM in supine with high volt stim to 135V   Sub-occipital release   After manual and ROM exercises up to 118 degrees
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
## 2016-05-27 ENCOUNTER — Ambulatory Visit: Payer: 59 | Admitting: Physical Therapy

## 2016-05-31 ENCOUNTER — Encounter: Payer: Self-pay | Admitting: Sports Medicine

## 2016-05-31 ENCOUNTER — Ambulatory Visit (INDEPENDENT_AMBULATORY_CARE_PROVIDER_SITE_OTHER): Payer: 59 | Admitting: Sports Medicine

## 2016-05-31 VITALS — BP 110/80 | HR 88 | Ht 66.0 in | Wt 215.8 lb

## 2016-05-31 DIAGNOSIS — M47812 Spondylosis without myelopathy or radiculopathy, cervical region: Secondary | ICD-10-CM

## 2016-05-31 DIAGNOSIS — G8929 Other chronic pain: Secondary | ICD-10-CM | POA: Diagnosis not present

## 2016-05-31 DIAGNOSIS — G894 Chronic pain syndrome: Secondary | ICD-10-CM

## 2016-05-31 DIAGNOSIS — M25512 Pain in left shoulder: Secondary | ICD-10-CM
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
## 2016-05-31 NOTE — Patient Instructions (Signed)

## 2016-06-02 ENCOUNTER — Ambulatory Visit: Payer: 59 | Admitting: Physical Therapy

## 2016-06-02 DIAGNOSIS — M79622 Pain in left upper arm: Secondary | ICD-10-CM

## 2016-06-02 DIAGNOSIS — M6281 Muscle weakness (generalized): Secondary | ICD-10-CM

## 2016-06-02 DIAGNOSIS — M542 Cervicalgia: Secondary | ICD-10-CM

## 2016-06-02 NOTE — Patient Instructions (Signed)
AROM of LUE- flexion to 110 degrees
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
## 2016-06-07 ENCOUNTER — Ambulatory Visit: Payer: 59 | Attending: Sports Medicine | Admitting: Physical Therapy

## 2016-06-07 DIAGNOSIS — M79622 Pain in left upper arm: Secondary | ICD-10-CM | POA: Diagnosis present

## 2016-06-07 DIAGNOSIS — M542 Cervicalgia: Secondary | ICD-10-CM | POA: Diagnosis present

## 2016-06-07 DIAGNOSIS — M6281 Muscle weakness (generalized): Secondary | ICD-10-CM | POA: Diagnosis present
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
## 2016-06-07 NOTE — Patient Instructions (Addendum)
AROM in flexion - 104 degrees   Patient reports pain in upper thoracic spine and lower cervical spine is "about an 8 or a 9"  Soft tissue mobilization   Joint mobilizations   Seated rows with 25# with 1" hold at end range   Manual cervical traction   AROM - 124 degrees into flexion on LUE   Standing ER with yellow t-band around her wrists

## 2016-06-09 ENCOUNTER — Ambulatory Visit: Payer: 59 | Admitting: Physical Therapy

## 2016-06-09 DIAGNOSIS — M6281 Muscle weakness (generalized): Secondary | ICD-10-CM

## 2016-06-09 DIAGNOSIS — M79622 Pain in left upper arm: Secondary | ICD-10-CM | POA: Diagnosis not present

## 2016-06-09 DIAGNOSIS — M542 Cervicalgia: Secondary | ICD-10-CM
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
## 2016-06-09 NOTE — Patient Instructions (Addendum)
AROM flexion - 122 degrees   Soft tissue assessment - notable trigger point in infraspinatus/teres minor on LUE, referred pain down through her arm. Performed soft tissue mobilization which patient reports was quite tender initially, however reduced pain and referral with continued STM applied.   Joint mobilizations grade I x 5 bouts from thoracic spine level T5/6 through C5/6 x 30" bouts, initially quite tender, however reduced symptoms with repeated bouts.   After manual - AROM to 140   UE Ranger x 8 repetitions through 130 degrees-140 degrees for 3 sets (improved scapular mechanics and upward rotation noted)   Wall slides x 6 for 3 sets (well tolerated, appropriate mechanics noted though fatigue and pain evident with repetitions  Yellow t-band ERs x 6 x 3 sets

## 2016-06-11 ENCOUNTER — Ambulatory Visit
Admission: RE | Admit: 2016-06-11 | Discharge: 2016-06-11 | Disposition: A | Discharge status: Home / Self Care | Payer: 59 | Source: Ambulatory Visit | Attending: Sports Medicine | Admitting: Sports Medicine

## 2016-06-11 DIAGNOSIS — M25512 Pain in left shoulder: Principal | ICD-10-CM

## 2016-06-11 DIAGNOSIS — G8929 Other chronic pain: Secondary | ICD-10-CM

## 2016-06-13 ENCOUNTER — Ambulatory Visit: Payer: 59 | Admitting: Physical Therapy

## 2016-06-13 DIAGNOSIS — M79622 Pain in left upper arm: Secondary | ICD-10-CM

## 2016-06-13 DIAGNOSIS — M542 Cervicalgia: Secondary | ICD-10-CM

## 2016-06-13 DIAGNOSIS — M6281 Muscle weakness (generalized): Secondary | ICD-10-CM
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
## 2016-06-16 ENCOUNTER — Encounter: Payer: Self-pay | Admitting: Family Medicine

## 2016-06-16 ENCOUNTER — Ambulatory Visit (INDEPENDENT_AMBULATORY_CARE_PROVIDER_SITE_OTHER): Payer: Self-pay | Admitting: Family Medicine

## 2016-06-16 DIAGNOSIS — F329 Major depressive disorder, single episode, unspecified: Secondary | ICD-10-CM

## 2016-06-16 DIAGNOSIS — G8929 Other chronic pain: Secondary | ICD-10-CM

## 2016-06-16 DIAGNOSIS — F32A Depression, unspecified: Secondary | ICD-10-CM

## 2016-06-16 DIAGNOSIS — F419 Anxiety disorder, unspecified: Secondary | ICD-10-CM

## 2016-06-16 DIAGNOSIS — M25512 Pain in left shoulder: Secondary | ICD-10-CM

## 2016-06-16 DIAGNOSIS — G44229 Chronic tension-type headache, not intractable: Secondary | ICD-10-CM

## 2016-06-16 MED ORDER — NORTRIPTYLINE HCL 50 MG PO CAPS: 50.0000 mg | ORAL_CAPSULE | Freq: Every day | ORAL | 1 refills | Status: DC

## 2016-06-16 MED ORDER — GABAPENTIN 300 MG PO CAPS: ORAL_CAPSULE | ORAL | 1 refills | Status: DC

## 2016-06-16 NOTE — Progress Notes (Signed)
Tommi Rumps, MD Phone: [phone]  Miranda Evans is a 56 y.o. female who presents today for follow-up.  Depression/anxiety: Patient notes she still has some depression and anxiety. She does note the nortriptyline has been helpful. She notes no SI since her last visit. No HI. Has been sleeping more during the day and waking up early in the morning. Has been crying a fair amount. She has a therapy appointment next week as well.  She continues to have issues with her neck and shoulder. She had an MRI of her cervical spine that was unchanged from prior. Physical therapy has been helping her and she can now lift her arm more than she was able to previously. Still gets pain across her shoulders and down into her left upper arm. Sometimes it shoots and sometimes steady pain. She is seeing sports medicine.  She continues to have some frontal tension headaches. Feels as though it is a squeezing sensation. She notes no new numbness or weakness. These headaches have been unchanged.  PMH: nonsmoker.   ROS see history of present illness  Objective  Physical Exam Vitals:   06/16/16 0843  BP: (!) 132/92  Pulse: 100  Temp: 98.6 F (37 C)    BP Readings from Last 3 Encounters:  06/16/16 (!) 132/92  05/31/16 110/80  05/04/16 130/90   Wt Readings from Last 3 Encounters:  06/16/16 213 lb 3.2 oz (96.7 kg)  05/31/16 215 lb 12.8 oz (97.9 kg)  05/04/16 217 lb (98.4 kg)    Physical Exam  Constitutional: No distress.  Cardiovascular: Normal rate, regular rhythm and normal heart sounds.   Pulmonary/Chest: Effort normal and breath sounds normal.  Musculoskeletal:  No midline neck tenderness, no midline neck step-off, no muscular neck tenderness  Neurological: She is alert.  CN 2-12 intact, 5/5 strength in bilateral biceps, triceps, grip, quads, hamstrings, plantar and dorsiflexion, sensation to light touch intact in bilateral UE and LE, normal gait  Skin: Skin is warm and dry. She is not  diaphoretic.     Assessment/Plan: Please see individual problem list.  Headache Tension in nature. Benign neurological exam. Has had imaging previously. We will increase her dose of nortriptyline to help with this and her depression. She'll continue to monitor.  Anxiety and depression Somewhat better. Still with some anxiety and depression. She thinks the nortriptyline has been beneficial. We will increase the dose. Advised that if this makes her drowsy she should let us know. She'll see the therapist next week as planned. Given return precautions.  Left shoulder pain Continues to have issues with this. MRI with some disc disease though stable from prior. Encouraged her to continue physical therapy. We will up titrate her gabapentin as prescribed below. If not improved after several weeks she can increase to 600 mg 3 times a day. We will also titrate her nortriptyline. She'll continue to follow sports medicine.   No orders of the defined types were placed in this encounter.   Meds ordered this encounter  Medications  . gabapentin (NEURONTIN) 300 MG capsule    Sig: Please take 600 mg (2 tablets) once in the morning, take 300 mg (1 tablet) once in the middle of the day by mouth, then take 600 mg (2 tablets) once prior to bed by mouth    Dispense:  180 capsule    Refill:  1  . nortriptyline (PAMELOR) 50 MG capsule    Sig: Take 1 capsule (50 mg total) by mouth at bedtime.  Dispense:  90 capsule    Refill:  1    Tommi Rumps, MD [place]

## 2016-06-16 NOTE — Assessment & Plan Note (Signed)
Tension in nature. Benign neurological exam. Has had imaging previously. We will increase her dose of nortriptyline to help with this and her depression. She'll continue to monitor.

## 2016-06-16 NOTE — Patient Instructions (Signed)
Nice to see you. I increased your dose of nortriptyline to 50 mg nightly. Were also going to have you increase your gabapentin to 600 mg in the morning and at night. You can take 300 mg in the middle of the day. Please see how this does over the next several weeks. If you get to the two-week mark and have not noticed a significant improvement please increase to 600 mg 3 times daily. Please continue physical therapy. If you develop thoughts of harming yourself please seek medical attention immediately.

## 2016-06-16 NOTE — Assessment & Plan Note (Signed)
Somewhat better. Still with some anxiety and depression. She thinks the nortriptyline has been beneficial. We will increase the dose. Advised that if this makes her drowsy she should let us know. She'll see the therapist next week as planned. Given return precautions.

## 2016-06-16 NOTE — Assessment & Plan Note (Signed)
Continues to have issues with this. MRI with some disc disease though stable from prior. Encouraged her to continue physical therapy. We will up titrate her gabapentin as prescribed below. If not improved after several weeks she can increase to 600 mg 3 times a day. We will also titrate her nortriptyline. She'll continue to follow sports medicine.

## 2016-06-17 ENCOUNTER — Ambulatory Visit: Payer: 59 | Admitting: Physical Therapy

## 2016-06-17 DIAGNOSIS — M542 Cervicalgia: Secondary | ICD-10-CM

## 2016-06-17 DIAGNOSIS — M79622 Pain in left upper arm: Secondary | ICD-10-CM | POA: Diagnosis not present

## 2016-06-17 DIAGNOSIS — M6281 Muscle weakness (generalized): Secondary | ICD-10-CM

## 2016-06-17 NOTE — Patient Instructions (Addendum)
AROM - 108 degrees of flexion   Dry needling to cervicl multifidi on L side at C5 x 15" with pistoning for 5" notable for   AROM to 118 degrees after manual therapy on 1st attempt, 126 degrees on second attempt   Chest press with 3# DB on LUE x 10 x 3 sets (mild pain noted, but tolerable)   LUE scaption with 1# x 8 repetitions   Prone horizontal abduction with ER x 8 with 1# DB x 2 sets (patient reports this was becoming too painful thus dc'd)   Supported ER at roughly 75 degrees of abduction x 12, x12, with 1# DB x 9 for 2 sets (reports arm felt fatigued at this point)   High volt stim to upper trapezius and infraspinatus and posterior deltoid/triceps muscle belly at 75V and hot pack applied over top
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
## 2016-06-20 ENCOUNTER — Ambulatory Visit: Payer: 59

## 2016-06-20 DIAGNOSIS — M79622 Pain in left upper arm: Secondary | ICD-10-CM

## 2016-06-20 DIAGNOSIS — M542 Cervicalgia: Secondary | ICD-10-CM

## 2016-06-20 DIAGNOSIS — M6281 Muscle weakness (generalized): Secondary | ICD-10-CM
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
## 2016-06-22 ENCOUNTER — Ambulatory Visit: Payer: 59

## 2016-06-22 DIAGNOSIS — M79622 Pain in left upper arm: Secondary | ICD-10-CM

## 2016-06-22 DIAGNOSIS — M542 Cervicalgia: Secondary | ICD-10-CM

## 2016-06-22 DIAGNOSIS — M6281 Muscle weakness (generalized): Secondary | ICD-10-CM
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
## 2016-06-23 ENCOUNTER — Ambulatory Visit: Payer: 59 | Admitting: Physical Therapy

## 2016-06-24 ENCOUNTER — Ambulatory Visit: Payer: 59 | Admitting: Psychology

## 2016-06-24 ENCOUNTER — Ambulatory Visit (INDEPENDENT_AMBULATORY_CARE_PROVIDER_SITE_OTHER): Payer: 59 | Admitting: Psychology

## 2016-06-24 DIAGNOSIS — F321 Major depressive disorder, single episode, moderate: Secondary | ICD-10-CM | POA: Diagnosis not present

## 2016-06-24 DIAGNOSIS — F4310 Post-traumatic stress disorder, unspecified: Secondary | ICD-10-CM

## 2016-06-29 ENCOUNTER — Ambulatory Visit: Payer: 59 | Admitting: Physical Therapy

## 2016-06-29 DIAGNOSIS — M542 Cervicalgia: Secondary | ICD-10-CM

## 2016-06-29 DIAGNOSIS — M79622 Pain in left upper arm: Secondary | ICD-10-CM | POA: Diagnosis not present

## 2016-06-29 DIAGNOSIS — M6281 Muscle weakness (generalized): Secondary | ICD-10-CM

## 2016-06-29 NOTE — Patient Instructions (Addendum)
L grip strength  45# 42# 32#   Worst pain in the last week was 5-6/10 -- after taking medication and relaxing can be a mild 2-3-4/10 pain   Quick Dash 38/11   AROM - 125 degrees   L cervical rotation - 53 degrees R cervoical - 58 degrees   CPAs throughout thoracic/lumbar spine
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
## 2016-06-30 ENCOUNTER — Telehealth: Payer: Self-pay | Admitting: Sports Medicine

## 2016-06-30 NOTE — Telephone Encounter (Signed)
This patient had MRI cervical on 06/09. There is another order in for MRI lumbar. Does this need to be completed?

## 2016-07-01 ENCOUNTER — Encounter: Payer: Self-pay | Admitting: Sports Medicine

## 2016-07-01 ENCOUNTER — Ambulatory Visit (INDEPENDENT_AMBULATORY_CARE_PROVIDER_SITE_OTHER): Payer: 59 | Admitting: Sports Medicine

## 2016-07-01 VITALS — BP 122/80 | HR 100 | Ht 66.0 in | Wt 212.6 lb

## 2016-07-01 DIAGNOSIS — G8929 Other chronic pain: Secondary | ICD-10-CM | POA: Diagnosis not present

## 2016-07-01 DIAGNOSIS — M5441 Lumbago with sciatica, right side: Secondary | ICD-10-CM

## 2016-07-01 DIAGNOSIS — M47812 Spondylosis without myelopathy or radiculopathy, cervical region: Secondary | ICD-10-CM | POA: Diagnosis not present

## 2016-07-01 NOTE — Telephone Encounter (Signed)
This was from last year and put in by Dr. Caryl Bis.  It is okay to discontinue this order at this time.  We may be ordering one in the future but will place it when needed.
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
## 2016-07-05 ENCOUNTER — Ambulatory Visit: Payer: 59 | Attending: Sports Medicine | Admitting: Physical Therapy

## 2016-07-05 DIAGNOSIS — M542 Cervicalgia: Secondary | ICD-10-CM | POA: Diagnosis present

## 2016-07-05 DIAGNOSIS — M79622 Pain in left upper arm: Secondary | ICD-10-CM | POA: Diagnosis not present

## 2016-07-05 DIAGNOSIS — M6281 Muscle weakness (generalized): Secondary | ICD-10-CM | POA: Insufficient documentation
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
## 2016-07-08 ENCOUNTER — Ambulatory Visit: Payer: 59 | Admitting: Physical Therapy

## 2016-07-12 ENCOUNTER — Ambulatory Visit: Payer: 59 | Admitting: Physical Therapy

## 2016-07-12 DIAGNOSIS — M6281 Muscle weakness (generalized): Secondary | ICD-10-CM

## 2016-07-12 DIAGNOSIS — M79622 Pain in left upper arm: Secondary | ICD-10-CM | POA: Diagnosis not present

## 2016-07-12 DIAGNOSIS — M542 Cervicalgia: Secondary | ICD-10-CM
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
## 2016-07-12 NOTE — Patient Instructions (Addendum)
R rotation to 46 degrees and reproduces her L upper arm/neck pain L rotation to 72 degrees mild pain reproduction   106 degrees of L arm flexion with pain radiating from the base of the neck  Joint mobilizations (UPAs and CPAs)   Soft tissue mobilization   High volt stim to lower thoracic spine to relieve spasming at 90V   AROM to 110 degrees with less pain reported after completion of manual tx.

## 2016-07-14 ENCOUNTER — Ambulatory Visit: Payer: 59 | Admitting: Physical Therapy

## 2016-07-14 DIAGNOSIS — M6281 Muscle weakness (generalized): Secondary | ICD-10-CM

## 2016-07-14 DIAGNOSIS — M79622 Pain in left upper arm: Secondary | ICD-10-CM | POA: Diagnosis not present

## 2016-07-14 DIAGNOSIS — M542 Cervicalgia: Secondary | ICD-10-CM

## 2016-07-14 NOTE — Patient Instructions (Addendum)
PROM ER   Sidelying ER with 1# DB x 12 for 3 sets (BW x 15 was too easy to start)   Bilateral ER with yellow t-band x 12 for 2 sets with cuing for keeping elbows at sides
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
## 2016-07-19 ENCOUNTER — Ambulatory Visit: Payer: 59 | Admitting: Physical Therapy

## 2016-07-19 ENCOUNTER — Encounter (INDEPENDENT_AMBULATORY_CARE_PROVIDER_SITE_OTHER): Payer: Self-pay | Admitting: Physical Medicine and Rehabilitation

## 2016-07-19 ENCOUNTER — Ambulatory Visit (INDEPENDENT_AMBULATORY_CARE_PROVIDER_SITE_OTHER): Payer: 59 | Admitting: Physical Medicine and Rehabilitation

## 2016-07-19 VITALS — BP 128/88 | HR 81

## 2016-07-19 DIAGNOSIS — M5010 Cervical disc disorder with radiculopathy, unspecified cervical region: Secondary | ICD-10-CM

## 2016-07-19 DIAGNOSIS — M5412 Radiculopathy, cervical region: Secondary | ICD-10-CM

## 2016-07-19 DIAGNOSIS — M79622 Pain in left upper arm: Secondary | ICD-10-CM | POA: Diagnosis not present

## 2016-07-19 DIAGNOSIS — M6281 Muscle weakness (generalized): Secondary | ICD-10-CM

## 2016-07-19 DIAGNOSIS — M542 Cervicalgia: Secondary | ICD-10-CM

## 2016-07-19 MED ORDER — DIAZEPAM 5 MG PO TABS: ORAL_TABLET | ORAL | 0 refills | Status: DC

## 2016-07-19 NOTE — Progress Notes (Deleted)
Left arm pain, pain across back of shoulder. Numbness and tingling in left arm. Ache is constant, using arm makes it worse. Left hand dominant. Symptoms have been present for over a year.
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
## 2016-07-19 NOTE — Patient Instructions (Addendum)
AROM into flexion to 135 degrees with notable UT substitution.   PROM into ER with therapist guiding, moved to patient directing with PVC pipe (up to 36 degrees in the scapular plane) with moist hot pack and High volt stimulation on posterior shoulder at [place] with 2# ankle weight on PVC pipe x 8 repetitions throughout minimally painful range, for 2 sets   ER at 90 degrees abduction with 1# weight x 10 repetitions for 3 sets supported by table   Low rows on cable machine x 10 with 5# x 2 sets

## 2016-07-20 ENCOUNTER — Encounter (INDEPENDENT_AMBULATORY_CARE_PROVIDER_SITE_OTHER): Payer: Self-pay | Admitting: Physical Medicine and Rehabilitation
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
## 2016-07-21 ENCOUNTER — Ambulatory Visit: Payer: 59 | Admitting: Physical Therapy

## 2016-07-21 ENCOUNTER — Ambulatory Visit: Payer: Self-pay | Admitting: Family Medicine

## 2016-07-21 DIAGNOSIS — M79622 Pain in left upper arm: Secondary | ICD-10-CM

## 2016-07-21 DIAGNOSIS — M6281 Muscle weakness (generalized): Secondary | ICD-10-CM

## 2016-07-21 DIAGNOSIS — M542 Cervicalgia: Secondary | ICD-10-CM

## 2016-07-21 NOTE — Patient Instructions (Addendum)
ER [facility] at 90 degrees with arm supported under the elbow x 10 for 2 sets   Joint mobilizations x 3 bouts, painful spots at C 3 on R, C7 on L, C 7 on L performed into T1 all UPAs x 30" bouts   Manual traction to cervical spine x 5 minutes   AROM to 120 degrees to complete session
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
## 2016-07-22 ENCOUNTER — Telehealth: Payer: Self-pay | Admitting: Sports Medicine

## 2016-07-22 NOTE — Telephone Encounter (Signed)
Patient called in reference to appointment on Tuesday 07/26/16. Patient stated Paulla Fore wanted to see her after her appointment with Dr. Ernestina Patches. Patient's appointment with Dr. Ernestina Patches is not until 08/01/16. Patient wanted to make sure Paulla Fore still wanted to wait until after her appointment with [facility].   Please call patient and advise.

## 2016-07-25 NOTE — Telephone Encounter (Signed)
OK to reschedule with Dr. Paulla Fore after she sees Dr. Ernestina Patches.

## 2016-07-26 ENCOUNTER — Ambulatory Visit: Payer: 59 | Admitting: Sports Medicine

## 2016-07-27 ENCOUNTER — Ambulatory Visit: Payer: 59 | Admitting: Physical Therapy

## 2016-07-27 DIAGNOSIS — M542 Cervicalgia: Secondary | ICD-10-CM

## 2016-07-27 DIAGNOSIS — M79622 Pain in left upper arm: Secondary | ICD-10-CM | POA: Diagnosis not present

## 2016-07-27 DIAGNOSIS — M6281 Muscle weakness (generalized): Secondary | ICD-10-CM

## 2016-07-27 NOTE — Patient Instructions (Addendum)
AROM flexion - to 140 degrees but reports it felt heavy and was unable to hold at this  CPAs (felt cavitation around T5)  Soft tissue mobilization  Sidelying ER with 2# DB x 10, 1# DB x 15, x 15  PROM flexion -- hard end feel around 130-140 not felt on R side   Towel forward elevation on LUE with pillow case  Scap punches   Finished with high volt e-stim to bicipital head and muscle belly and muscle belly of triceps at 65V continuous
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
## 2016-08-01 ENCOUNTER — Encounter (INDEPENDENT_AMBULATORY_CARE_PROVIDER_SITE_OTHER): Payer: Self-pay | Admitting: Physical Medicine and Rehabilitation

## 2016-08-01 ENCOUNTER — Ambulatory Visit (INDEPENDENT_AMBULATORY_CARE_PROVIDER_SITE_OTHER): Payer: 59 | Admitting: Physical Medicine and Rehabilitation

## 2016-08-01 ENCOUNTER — Ambulatory Visit (INDEPENDENT_AMBULATORY_CARE_PROVIDER_SITE_OTHER): Payer: 59

## 2016-08-01 VITALS — BP 130/79 | HR 72

## 2016-08-01 DIAGNOSIS — M5010 Cervical disc disorder with radiculopathy, unspecified cervical region: Secondary | ICD-10-CM | POA: Diagnosis not present

## 2016-08-01 DIAGNOSIS — M5412 Radiculopathy, cervical region: Secondary | ICD-10-CM

## 2016-08-01 MED ORDER — LIDOCAINE HCL (PF) 1 % IJ SOLN
2.0000 mL | Freq: Once | INTRAMUSCULAR | Status: AC
  Administered 2016-08-01: 2 mL

## 2016-08-01 MED ORDER — METHYLPREDNISOLONE ACETATE 80 MG/ML IJ SUSP
80.0000 mg | Freq: Once | INTRAMUSCULAR | Status: AC
  Administered 2016-08-01: 80 mg

## 2016-08-01 NOTE — Patient Instructions (Signed)

## 2016-08-01 NOTE — Progress Notes (Unsigned)
Fluoro Time: 17 sec  MGY: 12.40

## 2016-08-01 NOTE — Progress Notes (Deleted)
Patient is here today for planned left C7-T1 interlaminar injection. No change in symptoms.

## 2016-08-03 ENCOUNTER — Ambulatory Visit: Payer: 59 | Admitting: Physical Therapy

## 2016-08-03 NOTE — Procedures (Signed)
Miranda Evans is a 56 year old female who recently saw for evaluation for potential cervical epidural injection. She is followed by Dr. Paulla Fore and she'll follow-up with him in a couple weeks. She comes in today for planned left C7-T1 intralaminar epidural steroid injection for what was felt to be cervical radiculopathy failure with conservative care. Please see our prior evaluation and management note for further details and justification.  Cervical Epidural Steroid Injection - Interlaminar Approach with Fluoroscopic Guidance  Patient: Miranda Evans      Date of Birth: 08/11/1960 MRN: [id number] PCP: Leone Haven, MD      Visit Date: 08/01/2016   Universal Protocol:    Date/Time: 08/01/185:42 AM  Consent Given By: the patient  Position: PRONE  Additional Comments: Vital signs were monitored before and after the procedure. Patient was prepped and draped in the usual sterile fashion. The correct patient, procedure, and site was verified.   Injection Procedure Details:  Procedure Site One Meds Administered:  Meds ordered this encounter  Medications  . lidocaine (PF) (XYLOCAINE) 1 % injection 2 mL  . methylPREDNISolone acetate (DEPO-MEDROL) injection 80 mg     Laterality: Left  Location/Site:  C7-T1  Needle size: 20 G  Needle type: Touhy  Needle Placement: Paramedian epidural space  Findings:  -Contrast Used: 1 mL iohexol 180 mg iodine/mL   -Comments: Excellent flow of contrast into the epidural space.  Procedure Details: Using a paramedian approach from the side mentioned above, the region overlying the inferior lamina was localized under fluoroscopic visualization and the soft tissues overlying this structure were infiltrated with 4 ml. of 1% Lidocaine without Epinephrine. A # 20 gauge, Tuohy needle was inserted into the epidural space using a paramedian approach.  The epidural space was localized using loss of resistance along with lateral and contralateral oblique  bi-planar fluoroscopic views.  After negative aspirate for air, blood, and CSF, a 2 ml. volume of Isovue-250 was injected into the epidural space and the flow of contrast was observed. Radiographs were obtained for documentation purposes.   The injectate was administered into the level noted above.  Additional Comments:  The patient tolerated the procedure well No complications occurred Dressing: Band-Aid    Post-procedure details: Patient was observed during the procedure. Post-procedure instructions were reviewed.  Patient left the clinic in stable condition.

## 2016-08-04 ENCOUNTER — Ambulatory Visit (INDEPENDENT_AMBULATORY_CARE_PROVIDER_SITE_OTHER): Payer: 59 | Admitting: Psychology

## 2016-08-04 ENCOUNTER — Ambulatory Visit: Payer: 59 | Attending: Sports Medicine | Admitting: Physical Therapy

## 2016-08-04 DIAGNOSIS — F4310 Post-traumatic stress disorder, unspecified: Secondary | ICD-10-CM

## 2016-08-04 DIAGNOSIS — M542 Cervicalgia: Secondary | ICD-10-CM | POA: Insufficient documentation

## 2016-08-04 DIAGNOSIS — M6281 Muscle weakness (generalized): Secondary | ICD-10-CM | POA: Diagnosis present

## 2016-08-04 DIAGNOSIS — M79622 Pain in left upper arm: Secondary | ICD-10-CM | POA: Diagnosis not present

## 2016-08-04 DIAGNOSIS — F321 Major depressive disorder, single episode, moderate: Secondary | ICD-10-CM | POA: Diagnosis not present
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
## 2016-08-04 NOTE — Patient Instructions (Addendum)
AROM through 132 degrees (both painful and weak)   IR/ER -- 4+ or 5/5 -- though pain around long head of biceps area   ABduction - 4-/4/5 in the same area   Biceps/elbow flexion 5/5 but painful in long head of biceps area.   L rotation - 27 degrees through pain free  R rotation - 33 degrees through pain free  Rotations with towel for overpressure   L rotations in supine   Cervical traction manually in supine   CPAs   ROM after R rotation - 50 degrees L rotation - 56 degrees

## 2016-08-09 ENCOUNTER — Ambulatory Visit (INDEPENDENT_AMBULATORY_CARE_PROVIDER_SITE_OTHER): Payer: 59 | Admitting: Sports Medicine

## 2016-08-09 ENCOUNTER — Ambulatory Visit (INDEPENDENT_AMBULATORY_CARE_PROVIDER_SITE_OTHER): Payer: 59 | Admitting: Psychology

## 2016-08-09 ENCOUNTER — Encounter: Payer: Self-pay | Admitting: Sports Medicine

## 2016-08-09 VITALS — BP 128/86 | HR 86 | Ht 66.0 in | Wt 211.4 lb

## 2016-08-09 DIAGNOSIS — F321 Major depressive disorder, single episode, moderate: Secondary | ICD-10-CM

## 2016-08-09 DIAGNOSIS — F4310 Post-traumatic stress disorder, unspecified: Secondary | ICD-10-CM | POA: Diagnosis not present

## 2016-08-09 DIAGNOSIS — M47812 Spondylosis without myelopathy or radiculopathy, cervical region: Secondary | ICD-10-CM

## 2016-08-09 DIAGNOSIS — M25512 Pain in left shoulder: Secondary | ICD-10-CM

## 2016-08-09 DIAGNOSIS — G8929 Other chronic pain: Secondary | ICD-10-CM

## 2016-08-09 DIAGNOSIS — G479 Sleep disorder, unspecified: Secondary | ICD-10-CM

## 2016-08-09 DIAGNOSIS — G894 Chronic pain syndrome: Secondary | ICD-10-CM | POA: Diagnosis not present

## 2016-08-09 MED ORDER — DULOXETINE HCL 30 MG PO CPEP: 30.0000 mg | ORAL_CAPSULE | Freq: Every day | ORAL | 3 refills | Status: DC

## 2016-08-09 MED ORDER — PREGABALIN 75 MG PO CAPS: 75.0000 mg | ORAL_CAPSULE | Freq: Two times a day (BID) | ORAL | 1 refills | Status: DC

## 2016-08-09 NOTE — Patient Instructions (Signed)
Start the Lyrica and Cymbalta today. Decrease your gabapentin to 300 mg tonight, Wednesday and Thursday then discontinue it altogether. Decrease your nortriptyline to half a tablet as well for the next 3 nights.  Then discontinue.  Look into swimming and water aerobics as this can be beneficial.  We will try to get you set up for a sleep study and will try to coordinate this with Dr. Ellen Henri office.
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
## 2016-08-10 ENCOUNTER — Encounter: Payer: 59 | Admitting: Physical Therapy

## 2016-08-12 ENCOUNTER — Ambulatory Visit: Payer: 59 | Admitting: Physical Therapy

## 2016-08-12 ENCOUNTER — Telehealth: Payer: Self-pay | Admitting: Sports Medicine

## 2016-08-12 ENCOUNTER — Other Ambulatory Visit: Payer: Self-pay

## 2016-08-12 DIAGNOSIS — M79622 Pain in left upper arm: Secondary | ICD-10-CM | POA: Diagnosis not present

## 2016-08-12 DIAGNOSIS — M542 Cervicalgia: Secondary | ICD-10-CM

## 2016-08-12 DIAGNOSIS — G8929 Other chronic pain: Secondary | ICD-10-CM

## 2016-08-12 DIAGNOSIS — M5440 Lumbago with sciatica, unspecified side: Principal | ICD-10-CM

## 2016-08-12 DIAGNOSIS — M6281 Muscle weakness (generalized): Secondary | ICD-10-CM

## 2016-08-12 NOTE — Patient Instructions (Addendum)
AROM to 100 degrees of flexion   AROM abduction to 72 degrees   IR MMT - 4+/5 but painful in the area of middle deltoid  ER MMT - 4-/5 but painful in the area of middle deltoid   Abduction MMT 4-/4 / 5 but painful in the area of the middle deltoid   Cervical rotation -- 44 degrees bilaterally (pain when turning to the L in R levator scap and L upper trap, to the R just in the L upper trap)   Cervical extension/flexion deferred patient reports she gets dizzy and gets posterior cervical pain   ER Isometrics   ER PROM - 12 IR PROM - 66 Flexion PROM - limited to roughly 110-120  ER PROM stretching in 20 degrees abduction   Cervical Isometrics  Supine PVC shoulder flexion

## 2016-08-12 NOTE — Telephone Encounter (Signed)
Referral/order faxed, pt aware.

## 2016-08-12 NOTE — Telephone Encounter (Signed)
Needs new order to treat low back pain. F:[phone] ATTN: PAt  Physical Therapist (Pat)  TY,  -LL

## 2016-08-13 NOTE — Assessment & Plan Note (Signed)
Shoulder and arm pain consistent with cervical radiculitis.  Referral for epidural steroid injection with Dr. Ernestina Patches.

## 2016-08-13 NOTE — Assessment & Plan Note (Signed)
Chronic low back pain consistent with lumbar spondylosis.  Okay to resume gentle range of motion activities as well as AAOS spine conditioning program previously provided.  If any lack of improvement consider further diagnostic evaluation.
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
## 2016-08-16 ENCOUNTER — Encounter: Payer: Self-pay | Admitting: Family Medicine

## 2016-08-16 ENCOUNTER — Ambulatory Visit (INDEPENDENT_AMBULATORY_CARE_PROVIDER_SITE_OTHER): Payer: Self-pay | Admitting: Family Medicine

## 2016-08-16 ENCOUNTER — Ambulatory Visit (INDEPENDENT_AMBULATORY_CARE_PROVIDER_SITE_OTHER): Payer: 59 | Admitting: Psychology

## 2016-08-16 VITALS — BP 110/80 | HR 84 | Temp 98.5°F | Wt 213.2 lb

## 2016-08-16 DIAGNOSIS — G8929 Other chronic pain: Secondary | ICD-10-CM

## 2016-08-16 DIAGNOSIS — F321 Major depressive disorder, single episode, moderate: Secondary | ICD-10-CM

## 2016-08-16 DIAGNOSIS — M47812 Spondylosis without myelopathy or radiculopathy, cervical region: Secondary | ICD-10-CM

## 2016-08-16 DIAGNOSIS — F4310 Post-traumatic stress disorder, unspecified: Secondary | ICD-10-CM | POA: Diagnosis not present

## 2016-08-16 DIAGNOSIS — F32A Depression, unspecified: Secondary | ICD-10-CM

## 2016-08-16 DIAGNOSIS — F419 Anxiety disorder, unspecified: Secondary | ICD-10-CM

## 2016-08-16 DIAGNOSIS — R10816 Epigastric abdominal tenderness: Secondary | ICD-10-CM | POA: Insufficient documentation

## 2016-08-16 DIAGNOSIS — M5441 Lumbago with sciatica, right side: Secondary | ICD-10-CM

## 2016-08-16 DIAGNOSIS — F329 Major depressive disorder, single episode, unspecified: Secondary | ICD-10-CM

## 2016-08-16 DIAGNOSIS — R222 Localized swelling, mass and lump, trunk: Secondary | ICD-10-CM | POA: Insufficient documentation

## 2016-08-16 LAB — CBC
HEMATOCRIT: 39.5 % (ref 36.0–46.0)
HEMOGLOBIN: 12.8 g/dL (ref 12.0–15.0)
MCHC: 32.3 g/dL (ref 30.0–36.0)
MCV: 85.7 fl (ref 78.0–100.0)
Platelets: 311 10*3/uL (ref 150.0–400.0)
RBC: 4.61 Mil/uL (ref 3.87–5.11)
RDW: 14.1 % (ref 11.5–15.5)
WBC: 6.3 10*3/uL (ref 4.0–10.5)

## 2016-08-16 LAB — COMPREHENSIVE METABOLIC PANEL
ALK PHOS: 65 U/L (ref 39–117)
ALT: 18 U/L (ref 0–35)
AST: 11 U/L (ref 0–37)
Albumin: 4 g/dL (ref 3.5–5.2)
BUN: 12 mg/dL (ref 6–23)
CHLORIDE: 104 meq/L (ref 96–112)
CO2: 30 mEq/L (ref 19–32)
Calcium: 9.2 mg/dL (ref 8.4–10.5)
Creatinine, Ser: 0.71 mg/dL (ref 0.40–1.20)
GFR: 110.23 mL/min (ref >60.00)
Glucose, Bld: 94 mg/dL (ref 70–99)
POTASSIUM: 3.9 meq/L (ref 3.5–5.1)
Sodium: 140 mEq/L (ref 135–145)
TOTAL PROTEIN: 7.5 g/dL (ref 6.0–8.3)
Total Bilirubin: 0.7 mg/dL (ref 0.2–1.2)

## 2016-08-16 LAB — LIPASE: LIPASE: 11 U/L (ref 11.0–59.0)

## 2016-08-16 NOTE — Progress Notes (Signed)
Tommi Rumps, MD Phone: [phone]  Miranda Evans is a 56 y.o. female who presents today for follow-up.  Anxiety/depression: Patient has been seeing a therapist. Notes this has helped quite a bit. Helps her calm down. She is starting to look at things differently. She does note some anxiety and depression. She is trying to do positive thinking. No SI or HI.  She's been following with Dr. Paulla Fore for chronic shoulder pain and neck pain. He recently started her on Cymbalta and she's noted a little bit of nausea with this over the last 7 days. They tried to get her Lyrica though her insurance would not cover this. She also had a steroid injection which has helped somewhat. PT is been helpful as well.  She does note some chronic low back pain. Slightly worse than previously. No numbness or weakness. No loss of bladder function. No saddle anesthesia.  Patient also notes some intermittent diarrhea for some time now. Some days she'll have it all day and then she'll go for some time without having any issues. She notes a little bit of cramping with it. Some epigastric discomfort. No blood. No vomiting.  She also notes an area in her left lateral neck and supraclavicular area that is intermittently fuller than usual. Sometimes this will swell up and then will go down. There is no pain. There is no mass. This is been going on for a number of months.  PMH: nonsmoker.   ROS see history of present illness  Objective  Physical Exam Vitals:   08/16/16 1325  BP: 110/80  Pulse: 84  Temp: 98.5 F (36.9 C)  SpO2: 98%    BP Readings from Last 3 Encounters:  08/16/16 110/80  08/09/16 128/86  08/01/16 130/79   Wt Readings from Last 3 Encounters:  08/16/16 213 lb 3.2 oz (96.7 kg)  08/09/16 211 lb 6.4 oz (95.9 kg)  07/01/16 212 lb 9.6 oz (96.4 kg)    Physical Exam  Constitutional: No distress.  Cardiovascular: Normal rate, regular rhythm and normal heart sounds.   Pulmonary/Chest: Effort  normal and breath sounds normal.  Musculoskeletal: She exhibits no edema.  No midline neck tenderness, no midline spine step-off, diffuse paraspinous muscular tenderness and trapezius tenderness bilaterally  Neurological: She is alert.  CN 2-12 intact, 5/5 strength in bilateral biceps, triceps, grip, quads, hamstrings, plantar and dorsiflexion, sensation to light touch intact in bilateral UE and LE, normal gait  Skin: Skin is warm and dry. She is not diaphoretic.  Left sided lateral neck and supraclavicular fatty fullness, no palpable masses, no fullness on the right side, no cervical or supraclavicular lymph nodes   Assessment/Plan: Please see individual problem list.  Cervical spondylosis without myelopathy Following with sports medicine. Discussed continuing her Cymbalta. We'll send a message to Dr. Paulla Fore to see if they received a prior authorization for Lyrica or if she should continue the gabapentin.  Low back pain Continues to be an issue. Some muscular tenderness. She'll continue with treatment through Dr. Paulla Fore. Given return precautions.  Anxiety and depression Improved with therapy though still with some symptoms. Recently started on Cymbalta for her pain. We'll see if that is beneficial. She'll continue therapy.  Fullness of supraclavicular fossa Discussed that this may be a benign finding though could be something worrisome as well. We'll get a CT scan of her neck to evaluate the area  Epigastric abdominal tenderness without rebound tenderness Suspect tenderness she had on exam was related to diarrhea she had yesterday. We  will check lab as outlined below. Discussed that if she has persistent intermittent diarrhea before and she'll likely need to see GI.   Tommi Rumps, MD [place]

## 2016-08-16 NOTE — Progress Notes (Signed)
I believe the Prior Auth was completed when we received it.  I'll have Brandy  Follow it up tomorrow morning.  Also sorry my note wasn't completed but we briefly talked about trying to get her set up for a sleep study.  She's definitely got some symptoms of OSA and I think she would benefit from one.  I've always sent to [facility] but I'm obviously not ordering these as often as I used to and she would prefer to stay in [place].  Can you help out on this?

## 2016-08-16 NOTE — Assessment & Plan Note (Signed)
Discussed that this may be a benign finding though could be something worrisome as well. We'll get a CT scan of her neck to evaluate the area

## 2016-08-16 NOTE — Assessment & Plan Note (Signed)
Following with sports medicine. Discussed continuing her Cymbalta. We'll send a message to Dr. Paulla Fore to see if they received a prior authorization for Lyrica or if she should continue the gabapentin.

## 2016-08-16 NOTE — Assessment & Plan Note (Signed)
Improved with therapy though still with some symptoms. Recently started on Cymbalta for her pain. We'll see if that is beneficial. She'll continue therapy.

## 2016-08-16 NOTE — Assessment & Plan Note (Signed)
Suspect tenderness she had on exam was related to diarrhea she had yesterday. We will check lab as outlined below. Discussed that if she has persistent intermittent diarrhea before and she'll likely need to see GI.

## 2016-08-16 NOTE — Patient Instructions (Signed)
Nice to see you. Please see how you're depression and anxiety does with the Cymbalta. Please also see how you are pain does with this. I'll send a message to Dr. Paulla Fore to see if he would like you to continue the gabapentin given that this was not covered.

## 2016-08-16 NOTE — Assessment & Plan Note (Signed)
Continues to be an issue. Some muscular tenderness. She'll continue with treatment through Dr. Paulla Fore. Given return precautions.

## 2016-08-17 ENCOUNTER — Telehealth: Payer: Self-pay | Admitting: Family Medicine

## 2016-08-17 DIAGNOSIS — R0683 Snoring: Secondary | ICD-10-CM

## 2016-08-17 NOTE — Telephone Encounter (Signed)
We will try to get this approved. Order has been placed.

## 2016-08-17 NOTE — Telephone Encounter (Signed)
-----   Message from Leone Haven, MD sent at 08/17/2016 12:03 PM EDT ----- I attempted to call the patient to confirm her sleep apnea symptoms. She didn't answer and I left a message asking her to call back to the office. Can you check with her to see if she snores, stops breathing while she sleeps, is excessively sleepy when she wakes up, or if she falls asleep easily during the day? Thanks. Randall Hiss.  ----- Message ----- From: Juanda Chance, CMA Sent: 08/17/2016   9:14 AM To: Leone Haven, MD    ----- Message ----- From: Leone Haven, MD Sent: 08/16/2016   5:32 PM To: Gerda Diss, DO, Juanda Chance, CMA  No worries and absolutely we can arrange for the sleep study. I'll have Janett Billow contact her about the sleep study and we can get her set up in [place]. Randall Hiss.   ----- Message ----- From: Gerda Diss, DO Sent: 08/16/2016   5:13 PM To: Leone Haven, MD, Katrine Coho, CMA    ----- Message ----- From: Leone Haven, MD Sent: 08/16/2016   4:52 PM To: Gerda Diss, DO  Robyne Peers,  Thanks for your help in seeing this patient. I saw her today for follow-up and she noted that her insurance did not cover the Lyrica. I was wondering if you guys got a prior authorization from her insurance company. If she is unable to get the Lyrica I think she is interested in staying on the gabapentin as it has been somewhat helpful. Let me know what think.   Eric.

## 2016-08-17 NOTE — Progress Notes (Signed)
PA was done for Lyrica and coverage was denied by [employer]. It was decided by Dr. Paulla Fore to try pt on Cymbalta and Gabapentin and if her sx did not improve pt would contact the office and we would attempt the PA again.

## 2016-08-17 NOTE — Telephone Encounter (Signed)
Pt called back returning Dr. Ellen Henri call. Please advise, thank you!  Call pt @ [phone]

## 2016-08-17 NOTE — Telephone Encounter (Signed)
Patient states she does snore but does not have any of the other symptoms listed.

## 2016-08-17 NOTE — Progress Notes (Signed)
Patient would like sleep study set up

## 2016-08-18 ENCOUNTER — Ambulatory Visit: Payer: 59 | Admitting: Physical Therapy

## 2016-08-22 ENCOUNTER — Ambulatory Visit: Payer: 59 | Admitting: Physical Therapy

## 2016-08-24 ENCOUNTER — Ambulatory Visit
Admission: RE | Admit: 2016-08-24 | Discharge: 2016-08-24 | Disposition: A | Discharge status: Home / Self Care | Payer: 59 | Source: Ambulatory Visit | Attending: Family Medicine | Admitting: Family Medicine

## 2016-08-24 DIAGNOSIS — R222 Localized swelling, mass and lump, trunk: Secondary | ICD-10-CM | POA: Insufficient documentation

## 2016-08-24 MED ORDER — IOPAMIDOL (ISOVUE-300) INJECTION 61%
75.0000 mL | Freq: Once | INTRAVENOUS | Status: AC | PRN
  Administered 2016-08-24: 75 mL via INTRAVENOUS

## 2016-08-25 ENCOUNTER — Ambulatory Visit: Payer: Self-pay | Admitting: Psychology

## 2016-08-27 DIAGNOSIS — G479 Sleep disorder, unspecified: Secondary | ICD-10-CM | POA: Insufficient documentation

## 2016-08-27 NOTE — Assessment & Plan Note (Signed)
Multifactorial.  We will begin Lyrica and Cymbalta to see if this is beneficial for her.  We will slowly titrate her off of the gabapentin. Recommend aerobic exercise and aqua therapy can be beneficial.  Referral to physical therapy is artery been placed and she should continue with this.

## 2016-08-27 NOTE — Assessment & Plan Note (Signed)
We did discuss today that she is having significant disordered sleep according to her husband and the fact that she is waking up not feeling rested.  She does report snoring and the possibility of sleep apnea.  She has significant daytime somnolence and is having a difficult time managing her pain and her weight.  Will try to coordinate with Dr. Ellen Henri office for a sleep study in [place].

## 2016-08-27 NOTE — Assessment & Plan Note (Signed)
Status post injection with only minimal improvement.  Reevaluate in 6 additional weeks.

## 2016-08-27 NOTE — Assessment & Plan Note (Signed)
Radicular nature, only minimal improvement with epidural steroid injection.  She is not interested in referral to neurosurgery and there is no red flag symptoms.

## 2016-09-19 ENCOUNTER — Ambulatory Visit: Payer: 59 | Admitting: Sports Medicine

## 2016-09-19 ENCOUNTER — Ambulatory Visit (INDEPENDENT_AMBULATORY_CARE_PROVIDER_SITE_OTHER): Payer: 59 | Admitting: Psychology

## 2016-09-19 DIAGNOSIS — F4310 Post-traumatic stress disorder, unspecified: Secondary | ICD-10-CM | POA: Diagnosis not present

## 2016-09-19 DIAGNOSIS — F321 Major depressive disorder, single episode, moderate: Secondary | ICD-10-CM
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
## 2016-09-22 ENCOUNTER — Ambulatory Visit: Payer: 59 | Admitting: Sports Medicine

## 2016-09-26 ENCOUNTER — Ambulatory Visit: Payer: 59 | Admitting: Psychology

## 2016-09-27 ENCOUNTER — Ambulatory Visit (INDEPENDENT_AMBULATORY_CARE_PROVIDER_SITE_OTHER): Payer: 59 | Admitting: Sports Medicine

## 2016-09-27 ENCOUNTER — Encounter: Payer: Self-pay | Admitting: Sports Medicine

## 2016-09-27 VITALS — BP 130/84 | HR 90 | Ht 66.0 in | Wt 213.2 lb

## 2016-09-27 DIAGNOSIS — F419 Anxiety disorder, unspecified: Secondary | ICD-10-CM

## 2016-09-27 DIAGNOSIS — M5441 Lumbago with sciatica, right side: Secondary | ICD-10-CM

## 2016-09-27 DIAGNOSIS — G8929 Other chronic pain: Secondary | ICD-10-CM | POA: Diagnosis not present

## 2016-09-27 DIAGNOSIS — M47812 Spondylosis without myelopathy or radiculopathy, cervical region: Secondary | ICD-10-CM | POA: Diagnosis not present

## 2016-09-27 DIAGNOSIS — R10816 Epigastric abdominal tenderness: Secondary | ICD-10-CM | POA: Diagnosis not present

## 2016-09-27 DIAGNOSIS — F329 Major depressive disorder, single episode, unspecified: Secondary | ICD-10-CM | POA: Diagnosis not present

## 2016-09-27 MED ORDER — PREGABALIN 75 MG PO CAPS: 75.0000 mg | ORAL_CAPSULE | Freq: Two times a day (BID) | ORAL | 2 refills | Status: DC

## 2016-09-27 MED ORDER — METHOCARBAMOL 500 MG PO TABS: 500.0000 mg | ORAL_TABLET | Freq: Four times a day (QID) | ORAL | 2 refills | Status: DC | PRN

## 2016-09-27 NOTE — Progress Notes (Signed)
OFFICE VISIT NOTE Miranda Evans, [place] at [facility] [phone]  Miranda Evans - 56 y.o. female MRN [id number]  Date of birth: 21-Mar-1960  Visit Date: 09/27/2016  PCP: Leone Haven, MD   Referred by: Leone Haven, MD  Burlene Arnt, CMA acting as scribe for Dr. Paulla Fore.  SUBJECTIVE:   Chief Complaint  Patient presents with  . Follow-up    lumar spindylosis, chronic pain syndrome, LT shoulder pain   HPI: As below and per problem based documentation when appropriate.  Coverage of Lyrica was denied. She has not been taking Gabapentin. She has been taking Cymbalta. She would like refill on Methocarbamol.   She has noticed that she is sleeping a little more since starting Cymbalta, but some days are worse than others. She has not been doing aqua therapy, her [facility] [place] said that she would need a prescription or an order for aqua therapy. She reports that PT has been going well. She feels that they did reach a standstill with her LT arm. She still cannot comb her hair without pain. The pain radiates from the neck down into the LT arm. Overall pain seems to be about the same as the last time she was here but a little better than when she initially started. She has not been to PT in 2 weeks but she was given exercises to do at home.  She reports nausea and diarrhea on a daily basis which is a relatively new issue. She has also been under a lot of stress d/t her pain and looking for a job and feels this may be contributing.     Review of Systems  Constitutional: Negative for chills and fever.  Respiratory: Positive for shortness of breath. Negative for wheezing.   Cardiovascular: Positive for chest pain. Negative for palpitations.  Gastrointestinal: Positive for diarrhea and nausea.  Musculoskeletal: Positive for joint pain and neck pain. Negative for back pain.  Neurological: Positive  for tingling and headaches. Negative for dizziness.  Endo/Heme/Allergies: Does not bruise/bleed easily.    Otherwise per HPI.  OBJECTIVE:  VS:  HT:5\' 6"  (167.6 cm)   WT:213 lb 3.2 oz (96.7 kg)  BMI:34.43    BP:130/84  HR:90bpm  TEMP: ( )  RESP:98 %  PHYSICAL EXAM: Constitutional: WDWN, Non-toxic appearing. Psychiatric: Alert appropriate, stress good insight.  No significant depression Respiratory: No increased work of breathing. Trachea Midline Eyes: Pupils are equal. EOM intact without nystagmus. No scleral icterus  Abdominal exam is soft nontender, which she is obese.  No significant rebound tenderness. Bilateral upper extremities guarded motion as well as marked tenderness with palpation of multiple trigger points consistent with myofascial pain syndrome/fibromyalgia  ASSESSMENT & PLAN:   1. Cervical spondylosis without myelopathy   2. Chronic bilateral low back pain with right-sided sciatica   3. Anxiety and depression   4. Epigastric abdominal tenderness without rebound tenderness    PLAN:     Epigastric abdominal tenderness without rebound tenderness Overall reassuring, possibly related to medication use but no evidence of acute abdomen  Low back pain Chronic ongoing low back pain.  Responding well to physical therapy, continue with this prescription for aqua therapy discussed.  Cervical spondylosis without myelopathy No evidence of significant upper extremity myelopathy or worsening features, medication changes including Lyrica and Robaxin today.  Follow-up in 8 weeks for clinical reevaluation, can consider further diagnostic evaluation   ++++++++++++++++++++++++++++++++++++++++++++  No orders of the defined types were placed in this encounter.   Meds ordered this encounter  Medications  . pregabalin (LYRICA) 75 MG capsule    Sig: Take 1 capsule (75 mg total) by mouth 2 (two) times daily.    Dispense:  60 capsule    Refill:  2  . methocarbamol (ROBAXIN) 500 MG  tablet    Sig: Take 1 tablet (500 mg total) by mouth every 6 (six) hours as needed for muscle spasms.    Dispense:  60 tablet    Refill:  2    ++++++++++++++++++++++++++++++++++++++++++++ Follow-up: Return in about 8 weeks (around 11/22/2016).   Pertinent documentation may be included in additional procedure notes, imaging studies, problem based documentation and patient instructions. Please see these sections of the encounter for additional information regarding this visit. CMA/ATC served as Education administrator during this visit. History, Physical, and Plan performed by medical provider. Documentation and orders reviewed and attested to.      Gerda Diss, [facility] Physician

## 2016-09-27 NOTE — Patient Instructions (Signed)

## 2016-10-04 ENCOUNTER — Ambulatory Visit: Payer: 59 | Admitting: Psychology

## 2016-10-06 ENCOUNTER — Encounter: Payer: Self-pay | Admitting: Family Medicine

## 2016-10-25 ENCOUNTER — Telehealth: Payer: Self-pay

## 2016-10-25 DIAGNOSIS — G4733 Obstructive sleep apnea (adult) (pediatric): Secondary | ICD-10-CM

## 2016-10-25 NOTE — Telephone Encounter (Signed)
Patient states she sleeps during the day sometimes all day. Patient states she is unable to sleep all night at night.

## 2016-10-25 NOTE — Telephone Encounter (Signed)
Patient notified

## 2016-10-25 NOTE — Telephone Encounter (Signed)
Given daytime sleepiness we'll get her set up with a CPAP titration study. I will forward to Melissa to get this set up. Order has been placed.

## 2016-10-25 NOTE — Telephone Encounter (Signed)
Left voice mail to call back.  Need documentation for home sleep test interpretation , symptoms example excessive  daytime sleepiness, insomnia, mood disorders, impaired cognition.

## 2016-10-30 NOTE — Telephone Encounter (Signed)
Please check to see if this has been arranged. Thanks.

## 2016-10-31 NOTE — Telephone Encounter (Signed)
Has this been scheduled?

## 2016-10-31 NOTE — Telephone Encounter (Signed)
It has not been scheduled yet. It was sent to [facility] on 10/24.

## 2016-11-07 ENCOUNTER — Ambulatory Visit: Payer: Self-pay | Admitting: Family Medicine

## 2016-11-09 ENCOUNTER — Ambulatory Visit: Payer: Self-pay | Attending: Neurology

## 2016-11-09 ENCOUNTER — Telehealth: Payer: Self-pay | Admitting: *Deleted

## 2016-11-09 DIAGNOSIS — F5101 Primary insomnia: Secondary | ICD-10-CM | POA: Insufficient documentation

## 2016-11-09 DIAGNOSIS — G4733 Obstructive sleep apnea (adult) (pediatric): Secondary | ICD-10-CM | POA: Insufficient documentation

## 2016-11-09 NOTE — Telephone Encounter (Signed)
Copied from [place] #[id number]. Topic: General - Other >> Nov 09, 2016  3:52 PM Patrice Paradise wrote: Reason for CRM: Ria Comment for [facility] phone# [phone], option 4. Calling to verify the correct sleep study they need to run for the patient. Pls call to verify.

## 2016-11-09 NOTE — Telephone Encounter (Signed)
Please advise 

## 2016-11-10 NOTE — Telephone Encounter (Signed)
Called Miranda Evans and informed her that it is cpap titration

## 2016-11-22 ENCOUNTER — Ambulatory Visit: Payer: Self-pay | Admitting: Sports Medicine

## 2016-12-11 NOTE — Assessment & Plan Note (Signed)
Overall reassuring, possibly related to medication use but no evidence of acute abdomen

## 2016-12-11 NOTE — Assessment & Plan Note (Signed)
No evidence of significant upper extremity myelopathy or worsening features, medication changes including Lyrica and Robaxin today.  Follow-up in 8 weeks for clinical reevaluation, can consider further diagnostic evaluation

## 2016-12-11 NOTE — Assessment & Plan Note (Signed)
Chronic ongoing low back pain.  Responding well to physical therapy, continue with this prescription for aqua therapy discussed.

## 2016-12-20 ENCOUNTER — Ambulatory Visit (INDEPENDENT_AMBULATORY_CARE_PROVIDER_SITE_OTHER): Payer: Self-pay | Admitting: Family Medicine

## 2016-12-20 ENCOUNTER — Other Ambulatory Visit: Payer: Self-pay

## 2016-12-20 ENCOUNTER — Encounter: Payer: Self-pay | Admitting: Family Medicine

## 2016-12-20 DIAGNOSIS — F419 Anxiety disorder, unspecified: Secondary | ICD-10-CM

## 2016-12-20 DIAGNOSIS — F329 Major depressive disorder, single episode, unspecified: Secondary | ICD-10-CM

## 2016-12-20 DIAGNOSIS — R197 Diarrhea, unspecified: Secondary | ICD-10-CM

## 2016-12-20 DIAGNOSIS — M47812 Spondylosis without myelopathy or radiculopathy, cervical region: Secondary | ICD-10-CM

## 2016-12-20 NOTE — Assessment & Plan Note (Addendum)
Seems to be food related and anxiety related.  She was given a fodmap diet to consider.  If not improving she is to let us know and we can refer to GI.  Discussed GI referral at this time or imaging though she declined.  Given return precautions.
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
## 2016-12-20 NOTE — Patient Instructions (Signed)
Nice to see you. We will check in to any group therapy sessions for you in [place]. Please try the discount cards for your medications. Please try altering your foods as listed in the fodmap diet for your diarrhea.  If this does not improve or you have worsening pain please be evaluated.

## 2016-12-20 NOTE — Assessment & Plan Note (Signed)
Relatively stable.  We have provided her with a good Rx coupon to see if this will help with her medications and she was also given a form to fill out to see if we can get her help with Lyrica.  If she is not able to get this help we can have our pharmacist look into assistance programs for her.

## 2017-02-01 ENCOUNTER — Telehealth: Payer: Self-pay | Admitting: Family Medicine

## 2017-02-01 NOTE — Telephone Encounter (Signed)
I will look out for the forms and we can fill them out as I am able to once we receive them.
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
## 2017-02-03 ENCOUNTER — Telehealth: Payer: Self-pay | Admitting: Sports Medicine
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
## 2017-02-03 NOTE — Telephone Encounter (Signed)
Waiting on forms to arrive.

## 2017-02-06 NOTE — Telephone Encounter (Signed)
Spoke with patient and advised that forms have not been received. She will contact their office and check on this. Will await forms.

## 2017-02-14 NOTE — Telephone Encounter (Signed)
Spoke with patient, she is requesting that AVS from 07/19/16 and 05/31/16 be fax to [phone]. Info faxed per pt request.

## 2017-03-24 ENCOUNTER — Other Ambulatory Visit: Payer: Self-pay | Admitting: General Practice

## 2017-03-24 ENCOUNTER — Ambulatory Visit
Admission: RE | Admit: 2017-03-24 | Discharge: 2017-03-24 | Disposition: A | Discharge status: Home / Self Care | Payer: Disability Insurance | Source: Ambulatory Visit | Attending: General Practice | Admitting: General Practice

## 2017-03-24 DIAGNOSIS — M25512 Pain in left shoulder: Secondary | ICD-10-CM | POA: Diagnosis present

## 2017-03-24 DIAGNOSIS — M5412 Radiculopathy, cervical region: Secondary | ICD-10-CM | POA: Diagnosis present

## 2017-03-24 DIAGNOSIS — M5137 Other intervertebral disc degeneration, lumbosacral region: Secondary | ICD-10-CM | POA: Diagnosis not present

## 2017-03-24 DIAGNOSIS — M5136 Other intervertebral disc degeneration, lumbar region: Secondary | ICD-10-CM | POA: Diagnosis not present

## 2017-03-24 DIAGNOSIS — M545 Low back pain: Secondary | ICD-10-CM | POA: Diagnosis present

## 2017-03-24 DIAGNOSIS — S3992XA Unspecified injury of lower back, initial encounter: Secondary | ICD-10-CM

## 2017-06-20 ENCOUNTER — Ambulatory Visit: Payer: Self-pay | Admitting: Family Medicine

## 2017-06-26 ENCOUNTER — Encounter: Payer: Self-pay | Admitting: Family Medicine

## 2017-06-26 ENCOUNTER — Ambulatory Visit (INDEPENDENT_AMBULATORY_CARE_PROVIDER_SITE_OTHER): Payer: Disability Insurance | Admitting: Family Medicine

## 2017-06-26 DIAGNOSIS — F419 Anxiety disorder, unspecified: Secondary | ICD-10-CM

## 2017-06-26 DIAGNOSIS — M47812 Spondylosis without myelopathy or radiculopathy, cervical region: Secondary | ICD-10-CM

## 2017-06-26 DIAGNOSIS — R197 Diarrhea, unspecified: Secondary | ICD-10-CM

## 2017-06-26 DIAGNOSIS — F329 Major depressive disorder, single episode, unspecified: Secondary | ICD-10-CM

## 2017-06-26 MED ORDER — PREGABALIN 75 MG PO CAPS: 75.0000 mg | ORAL_CAPSULE | Freq: Two times a day (BID) | ORAL | 2 refills | Status: DC

## 2017-06-26 MED ORDER — METHOCARBAMOL 500 MG PO TABS: 500.0000 mg | ORAL_TABLET | Freq: Four times a day (QID) | ORAL | 2 refills | Status: DC | PRN

## 2017-06-26 MED ORDER — DULOXETINE HCL 60 MG PO CPEP: 60.0000 mg | ORAL_CAPSULE | Freq: Every day | ORAL | 1 refills | Status: DC
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
## 2017-06-28 NOTE — Assessment & Plan Note (Signed)
She continues to have some symptoms.  Lyrica and Robaxin had been helpful though she is out of these.  Cymbalta was helpful as well.  We will refill these things.  If Lyrica is not affordable we can try gabapentin again.

## 2017-06-28 NOTE — Assessment & Plan Note (Signed)
To needs to intermittently be an issue.  I reinforce that she needs to see GI for this and offered referral that she deferred.  Discussed that she also needs to see GI for her hemorrhoids that bleed.  She likely needs a colonoscopy.  I reinforced this with her as well and she opted against this at this time given lack of insurance.  She will let us know when she is ready for this referral.  We will request her prior colonoscopy report.
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
## 2017-07-03 ENCOUNTER — Telehealth: Payer: Self-pay | Admitting: Family Medicine

## 2017-07-03 ENCOUNTER — Telehealth: Payer: Self-pay

## 2017-07-03 NOTE — Telephone Encounter (Signed)
Miranda Evans, Miranda Evans, CMA 19 minutes ago (2:12 PM)     Patient states that she would like to try gabapentin and the zoloft.

## 2017-07-03 NOTE — Telephone Encounter (Signed)
Please advise 

## 2017-07-03 NOTE — Telephone Encounter (Signed)
Copied from [place] [id number]. Topic: General - Other >> Jul 03, 2017 11:20 AM Lennox Solders wrote: Reason for CRM:pt is calling she can not afford lyrica cost is 400.00 and duloxetine 363.00. Pt does not have insurance. Pt needs something cheaper. [facility] [address] in [place].

## 2017-07-03 NOTE — Telephone Encounter (Signed)
Patient states that she would like to try gabapentin and the zoloft.

## 2017-07-03 NOTE — Telephone Encounter (Signed)
Left message to return call, ok for pec to speak to patient about message below 

## 2017-07-03 NOTE — Telephone Encounter (Signed)
Please see if she is currently taking the cymbalta or if she has stopped this. We could try gabapentin for her pain and zoloft for anxiety and depression. If she is ok with those I can send them to her pharmacy. Thanks.

## 2017-07-04 MED ORDER — SERTRALINE HCL 50 MG PO TABS: 50.0000 mg | ORAL_TABLET | Freq: Every day | ORAL | 3 refills | Status: DC

## 2017-07-04 MED ORDER — GABAPENTIN 100 MG PO CAPS: 100.0000 mg | ORAL_CAPSULE | Freq: Three times a day (TID) | ORAL | 3 refills | Status: DC

## 2017-07-04 NOTE — Telephone Encounter (Signed)
Gabapentin and zoloft sent to patients pharmacy. She needs to monitor for drowsiness with the gabapentin and if this occurs she needs to let us know. If the gabapentin is not beneficial after 1 week she should contact us. Thanks.

## 2017-07-04 NOTE — Telephone Encounter (Signed)
Patient notified

## 2017-07-04 NOTE — Telephone Encounter (Signed)
Please confirm if she is taking cymbalta or not. I will need to know this so that I can give directions on how to stop this and start the zoloft. Thanks.

## 2017-07-04 NOTE — Addendum Note (Signed)
Addended by: Caryl Bis, Karsynn Deweese G on: 07/04/2017 02:30 PM   Modules accepted: Orders

## 2017-07-04 NOTE — Telephone Encounter (Signed)
Patient states she is not taking cymbalta

## 2017-10-10 ENCOUNTER — Encounter: Payer: Self-pay | Admitting: Family Medicine

## 2017-10-10 ENCOUNTER — Ambulatory Visit (INDEPENDENT_AMBULATORY_CARE_PROVIDER_SITE_OTHER): Payer: Disability Insurance | Admitting: Family Medicine

## 2017-10-10 VITALS — BP 140/98 | HR 94 | Temp 98.1°F | Ht 66.0 in | Wt 223.6 lb

## 2017-10-10 DIAGNOSIS — Z111 Encounter for screening for respiratory tuberculosis: Secondary | ICD-10-CM

## 2017-10-10 DIAGNOSIS — K529 Noninfective gastroenteritis and colitis, unspecified: Secondary | ICD-10-CM

## 2017-10-10 DIAGNOSIS — M47812 Spondylosis without myelopathy or radiculopathy, cervical region: Secondary | ICD-10-CM

## 2017-10-10 DIAGNOSIS — F419 Anxiety disorder, unspecified: Secondary | ICD-10-CM

## 2017-10-10 DIAGNOSIS — F329 Major depressive disorder, single episode, unspecified: Secondary | ICD-10-CM

## 2017-10-10 DIAGNOSIS — K625 Hemorrhage of anus and rectum: Secondary | ICD-10-CM

## 2017-10-10 DIAGNOSIS — I10 Essential (primary) hypertension: Secondary | ICD-10-CM | POA: Insufficient documentation

## 2017-10-10 DIAGNOSIS — R030 Elevated blood-pressure reading, without diagnosis of hypertension: Secondary | ICD-10-CM

## 2017-10-10 DIAGNOSIS — Z1239 Encounter for other screening for malignant neoplasm of breast: Secondary | ICD-10-CM

## 2017-10-10 DIAGNOSIS — R197 Diarrhea, unspecified: Secondary | ICD-10-CM

## 2017-10-10 NOTE — Patient Instructions (Addendum)
Nice to see you. I referred you to GI.  If you have recurrence of your bleeding please be evaluated. Please work on getting patient assistance set up if you meet criteria. I have referred you for a mammogram.  Please call to get this set up. Please let us know if your Zoloft is not affordable at [facility] and we can send it to [facility]. Please check on the price for the flu shot at the pharmacy.  It is around $38 here.

## 2017-10-10 NOTE — Assessment & Plan Note (Signed)
Elevated today.  We will recheck it when she returns for her PPD read.

## 2017-10-10 NOTE — Assessment & Plan Note (Signed)
She will continue her current regimen.  She will continue her exercises.  She will continue to follow with sports medicine.

## 2017-10-10 NOTE — Progress Notes (Signed)
Tommi Rumps, MD Phone: [phone]  Miranda Evans is a 57 y.o. female who presents today for f/u.  CC: chronic pain, anxiety/depression, chronic diarrhea  Chronic pain: Patient notes some days are better than others.  She does note if her shoulder and her leg got to be improved she would be in a good place.  She continues on gabapentin and Robaxin.  Notes intermittent numbness and a hot sensation in her left anterior thigh depending on the position she is in.  She is doing exercises for this.  There have been no significant changes.  Anxiety/depression: She does note she has been feeling down for several weeks.  She is not able to do what she used to do.  Some anxiety when driving or out in crowds.  She is now on Zoloft currently due to cost issues.  She is checking with the pharmacy today on that.  No SI.  Diarrhea: Notes this comes and goes.  She has been hesitant to see GI given insurance issues.  No abdominal pain.  Previously did have a small amount of blood in the toilet water with a bowel movement and when wiping with a bowel movement.  None recently.  Some nausea when this occurs.  No vomiting.  Social History   Tobacco Use  Smoking Status Never Smoker  Smokeless Tobacco Never Used     ROS see history of present illness  Objective  Physical Exam Vitals:   10/10/17 1105  BP: (!) 140/98  Pulse: 94  Temp: 98.1 F (36.7 C)  SpO2: 96%    BP Readings from Last 3 Encounters:  10/10/17 (!) 140/98  06/26/17 130/90  12/20/16 116/80   Wt Readings from Last 3 Encounters:  10/10/17 223 lb 9.6 oz (101.4 kg)  06/26/17 225 lb 4 oz (102.2 kg)  12/20/16 219 lb 6.4 oz (99.5 kg)    Physical Exam  Constitutional: No distress.  Cardiovascular: Normal rate, regular rhythm and normal heart sounds.  Pulmonary/Chest: Effort normal and breath sounds normal.  Abdominal: Soft. Bowel sounds are normal. She exhibits no distension.  Minimal mid discomfort with no guarding or  rebound  Musculoskeletal: She exhibits no edema.  Neurological: She is alert.  5/5 strength in bilateral biceps, triceps, grip, quads, hamstrings, plantar and dorsiflexion, sensation to light touch intact in bilateral UE and LE, normal gait  Skin: Skin is warm and dry. She is not diaphoretic.     Assessment/Plan: Please see individual problem list.  Cervical spondylosis without myelopathy She will continue her current regimen.  She will continue her exercises.  She will continue to follow with sports medicine.  Anxiety and depression Some mild symptoms.  She has yet to start on Zoloft.  I encouraged her to get this from her pharmacy if affordable.  If not we can send to [facility].  Diarrhea Chronic intermittent issue.  She has noted blood in the past with wiping and has been hesitant to see GI.  No recent bleeding.  She is willing to see GI.  Referral placed.  Given return precautions.  Elevated BP without diagnosis of hypertension Elevated today.  We will recheck it when she returns for her PPD read.   Health Maintenance: Mammogram ordered.  Patient does not had insurance and has had difficulty getting screening exams completed.  Patient assistance paperwork given to the patient.  Orders Placed This Encounter  Procedures  . MM 3D SCREEN BREAST BILATERAL    Standing Status:   Future    Standing  Expiration Date:   12/11/2018    Order Specific Question:   Reason for Exam (SYMPTOM  OR DIAGNOSIS REQUIRED)    Answer:   breast cancer screening    Order Specific Question:   Is the patient pregnant?    Answer:   No    Order Specific Question:   Preferred imaging location?    Answer:   [facility]  . Ambulatory referral to Gastroenterology    Referral Priority:   Routine    Referral Type:   Consultation    Referral Reason:   Specialty Services Required    Number of Visits Requested:   1  . PPD    Order Specific Question:   Has patient ever tested positive?    Answer:   No     No orders of the defined types were placed in this encounter.    Tommi Rumps, MD [facility]

## 2017-10-10 NOTE — Assessment & Plan Note (Signed)
Chronic intermittent issue.  She has noted blood in the past with wiping and has been hesitant to see GI.  No recent bleeding.  She is willing to see GI.  Referral placed.  Given return precautions.

## 2017-10-10 NOTE — Assessment & Plan Note (Signed)
Some mild symptoms.  She has yet to start on Zoloft.  I encouraged her to get this from her pharmacy if affordable.  If not we can send to [facility].

## 2017-10-12 ENCOUNTER — Ambulatory Visit: Payer: Self-pay

## 2017-10-12 DIAGNOSIS — Z111 Encounter for screening for respiratory tuberculosis: Secondary | ICD-10-CM

## 2017-10-12 LAB — TB SKIN TEST: TB Skin Test: NEGATIVE

## 2017-10-12 NOTE — Progress Notes (Signed)
Pt was here today for PPD read at 2:10 PM  RD read results were was negative  Sent to PCP pt's form place on desk ready to be sign and filled out.   Pt would like to pick up form once completed.

## 2017-10-17 ENCOUNTER — Ambulatory Visit: Payer: Self-pay

## 2017-10-27 NOTE — Progress Notes (Signed)
Did not check pt's BP when they came for the Ppd read. Pt has been scheduled for a NV for a BP check on 11/6 @ 2 PM .

## 2017-11-08 ENCOUNTER — Ambulatory Visit: Payer: Self-pay | Admitting: Lab

## 2017-11-08 VITALS — BP 152/78 | HR 109

## 2017-11-08 DIAGNOSIS — R030 Elevated blood-pressure reading, without diagnosis of hypertension: Secondary | ICD-10-CM

## 2017-11-08 NOTE — Progress Notes (Signed)
  Pt here today to get Bp checked, results at 152/78. Talked to Dr. Caryl Bis about the Bp. He said it was okay as long as she was not having symptoms, which she was not

## 2017-11-10 NOTE — Progress Notes (Signed)
BP remains elevated. Please see if the patient has been checking this at home. If she has not she needs to start checking at home and bring in readings in one week. If she has been checking it at home please see what her BP has been running.

## 2017-11-10 NOTE — Progress Notes (Signed)
Called Pt and she stated that she has not been checking her Bp. Pt also stated that she does not have a machine to check her Bp, Pt stated that she will try to get it checked at least one time a day from somewhere.

## 2017-11-12 NOTE — Progress Notes (Signed)
Noted.  Blood pressure is uncontrolled in the office.  I would suggest adding a low-dose of medication for this.  If she is willing I can send this to her pharmacy.

## 2017-11-13 ENCOUNTER — Telehealth: Payer: Self-pay

## 2017-11-13 NOTE — Telephone Encounter (Signed)
Copied from [place] [id number]. Topic: Quick Sport and exercise psychologist Patient (Clinic Use ONLY) >> Nov 13, 2017  9:30 AM Neta Ehlers, RMA wrote: Reason for CRM: Called Pt to tell her that Dr Caryl Bis stated that since her  Blood pressure is uncontrolled in the office.  He would suggest adding a low-dose of medication for this.  If she is willing he can send this to her pharmacy. Okay for PEC to speak to Pt >> Nov 13, 2017 10:05 AM Alfredia Ferguson R wrote: Patient returned call and stated that its ok to send med to pharmacy.    [facility] [place], [place] (Phone) [phone] (Fax)

## 2017-11-13 NOTE — Progress Notes (Signed)
Called Pt to tell her that Dr Caryl Bis stated that since her  Blood pressure is uncontrolled in the office.  He would suggest adding a low-dose of medication for this.  If she is willing he can send this to her pharmacy. Okay for PEC to speak to Pt

## 2017-11-13 NOTE — Telephone Encounter (Signed)
Sent to PCP to send in additional BP medication

## 2017-11-14 ENCOUNTER — Encounter: Payer: Self-pay | Admitting: Gastroenterology

## 2017-11-14 ENCOUNTER — Ambulatory Visit (INDEPENDENT_AMBULATORY_CARE_PROVIDER_SITE_OTHER): Payer: Disability Insurance | Admitting: Gastroenterology

## 2017-11-14 VITALS — BP 123/72 | HR 97 | Resp 17 | Ht 66.0 in | Wt 225.6 lb

## 2017-11-14 DIAGNOSIS — K580 Irritable bowel syndrome with diarrhea: Secondary | ICD-10-CM

## 2017-11-14 DIAGNOSIS — K529 Noninfective gastroenteritis and colitis, unspecified: Secondary | ICD-10-CM

## 2017-11-14 MED ORDER — AMITRIPTYLINE HCL 50 MG PO TABS: 50.0000 mg | ORAL_TABLET | Freq: Every day | ORAL | 0 refills | Status: DC

## 2017-11-14 MED ORDER — AMLODIPINE BESYLATE 5 MG PO TABS: 5.0000 mg | ORAL_TABLET | Freq: Every day | ORAL | 3 refills | Status: DC
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
## 2017-11-14 NOTE — Telephone Encounter (Signed)
Amlodipine sent to pharmacy.  Patient needs a BP check in 4 weeks with nursing.  Please get her scheduled for this.  Thanks.

## 2017-11-14 NOTE — Addendum Note (Signed)
Addended by: Leone Haven on: 11/14/2017 06:39 PM   Modules accepted: Orders

## 2017-11-15 NOTE — Telephone Encounter (Signed)
Called and spoke with patient. Pt advised and has been scheduled for a NV on 12/12/2017  @ 10:30 AM

## 2017-12-12 ENCOUNTER — Ambulatory Visit (INDEPENDENT_AMBULATORY_CARE_PROVIDER_SITE_OTHER): Payer: Disability Insurance | Admitting: *Deleted

## 2017-12-12 VITALS — BP 130/88 | HR 114 | Resp 18

## 2017-12-12 DIAGNOSIS — I10 Essential (primary) hypertension: Secondary | ICD-10-CM

## 2017-12-12 DIAGNOSIS — R030 Elevated blood-pressure reading, without diagnosis of hypertension: Secondary | ICD-10-CM

## 2017-12-12 NOTE — Progress Notes (Signed)
Patient here for nurse visit BP check per order from phone note dated 11/13/17   Patient reports compliance with prescribed BP medications: yes  Last dose of BP medication: 8 am  BP Readings from Last 3 Encounters:  12/12/17 130/88  11/14/17 123/72  11/08/17 (!) 152/78   Pulse Readings from Last 3 Encounters:  12/12/17 (!) 114  11/14/17 97  11/08/17 (!) 109   Patient pulse above her baseline ask patient any new medications patient stated she was recently started on Amitriptyline, advised PCP whom evaluated patient pulse. Patient was advised by PCP to monitor pulse at home and if she could feel pulse to call office and to report readings in one week , patient voiced understanding, she would call if she felt her heart beat increase and she would call with readings.   Patient verbalized understanding of instructions.   Kerin Salen, LPN

## 2017-12-14 NOTE — Progress Notes (Signed)
Blood pressure was adequately controlled.  Cardiovascular exam with tachycardia and normal rhythm with no murmurs rubs or gallops.  She should continue with her current blood pressure medication.  Blood pressure is adequately controlled.  She should have follow-up with me in 3 to 4 months if this is not already scheduled.

## 2017-12-15 NOTE — Progress Notes (Signed)
Patient notified and voiced understanding.

## 2017-12-19 ENCOUNTER — Other Ambulatory Visit: Payer: Self-pay | Admitting: Gastroenterology

## 2017-12-19 DIAGNOSIS — K580 Irritable bowel syndrome with diarrhea: Secondary | ICD-10-CM

## 2017-12-21 ENCOUNTER — Other Ambulatory Visit: Payer: Self-pay

## 2017-12-21 ENCOUNTER — Ambulatory Visit (INDEPENDENT_AMBULATORY_CARE_PROVIDER_SITE_OTHER): Payer: Disability Insurance | Admitting: Gastroenterology

## 2017-12-21 ENCOUNTER — Encounter: Payer: Self-pay | Admitting: Gastroenterology

## 2017-12-21 VITALS — BP 149/85 | HR 98 | Resp 17 | Ht 66.0 in | Wt 221.8 lb

## 2017-12-21 DIAGNOSIS — K529 Noninfective gastroenteritis and colitis, unspecified: Secondary | ICD-10-CM

## 2017-12-21 DIAGNOSIS — K580 Irritable bowel syndrome with diarrhea: Secondary | ICD-10-CM

## 2017-12-21 DIAGNOSIS — Z800 Family history of malignant neoplasm of digestive organs: Secondary | ICD-10-CM
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
## 2018-01-12 ENCOUNTER — Encounter: Payer: Self-pay | Admitting: *Deleted

## 2018-01-15 ENCOUNTER — Encounter: Payer: Self-pay | Admitting: Student

## 2018-01-15 ENCOUNTER — Other Ambulatory Visit: Payer: Self-pay

## 2018-01-15 ENCOUNTER — Ambulatory Visit: Payer: Self-pay | Admitting: Certified Registered"

## 2018-01-15 ENCOUNTER — Ambulatory Visit
Admission: RE | Admit: 2018-01-15 | Discharge: 2018-01-15 | Disposition: A | Discharge status: Home / Self Care | Payer: Self-pay | Source: Ambulatory Visit | Attending: Gastroenterology | Admitting: Gastroenterology

## 2018-01-15 ENCOUNTER — Encounter
Admission: RE | Disposition: A | Discharge status: Home / Self Care | Payer: Self-pay | Source: Ambulatory Visit | Attending: Gastroenterology

## 2018-01-15 DIAGNOSIS — Z800 Family history of malignant neoplasm of digestive organs: Secondary | ICD-10-CM | POA: Insufficient documentation

## 2018-01-15 DIAGNOSIS — F419 Anxiety disorder, unspecified: Secondary | ICD-10-CM | POA: Insufficient documentation

## 2018-01-15 DIAGNOSIS — D125 Benign neoplasm of sigmoid colon: Secondary | ICD-10-CM

## 2018-01-15 DIAGNOSIS — Z79899 Other long term (current) drug therapy: Secondary | ICD-10-CM | POA: Insufficient documentation

## 2018-01-15 DIAGNOSIS — I10 Essential (primary) hypertension: Secondary | ICD-10-CM | POA: Insufficient documentation

## 2018-01-15 DIAGNOSIS — K635 Polyp of colon: Secondary | ICD-10-CM | POA: Insufficient documentation

## 2018-01-15 DIAGNOSIS — K5730 Diverticulosis of large intestine without perforation or abscess without bleeding: Secondary | ICD-10-CM | POA: Insufficient documentation

## 2018-01-15 DIAGNOSIS — G4730 Sleep apnea, unspecified: Secondary | ICD-10-CM | POA: Insufficient documentation

## 2018-01-15 DIAGNOSIS — K648 Other hemorrhoids: Secondary | ICD-10-CM | POA: Insufficient documentation

## 2018-01-15 DIAGNOSIS — Z1211 Encounter for screening for malignant neoplasm of colon: Secondary | ICD-10-CM | POA: Insufficient documentation

## 2018-01-15 DIAGNOSIS — K644 Residual hemorrhoidal skin tags: Secondary | ICD-10-CM | POA: Insufficient documentation

## 2018-01-15 DIAGNOSIS — K529 Noninfective gastroenteritis and colitis, unspecified: Secondary | ICD-10-CM

## 2018-01-15 DIAGNOSIS — F329 Major depressive disorder, single episode, unspecified: Secondary | ICD-10-CM | POA: Insufficient documentation

## 2018-01-15 HISTORY — DX: Essential (primary) hypertension: I10

## 2018-01-15 HISTORY — DX: Anxiety disorder, unspecified: F41.9

## 2018-01-15 HISTORY — DX: Myoneural disorder, unspecified: G70.9

## 2018-01-15 HISTORY — DX: Depression, unspecified: F32.A

## 2018-01-15 HISTORY — PX: COLONOSCOPY WITH PROPOFOL: SHX5780

## 2018-01-15 HISTORY — DX: Sleep apnea, unspecified: G47.30

## 2018-01-15 HISTORY — DX: Major depressive disorder, single episode, unspecified: F32.9

## 2018-01-15 SURGERY — COLONOSCOPY WITH PROPOFOL
Anesthesia: General

## 2018-01-15 MED ORDER — LIDOCAINE HCL (CARDIAC) PF 100 MG/5ML IV SOSY
PREFILLED_SYRINGE | INTRAVENOUS | Status: DC | PRN
  Administered 2018-01-15: 60 mg via INTRATRACHEAL

## 2018-01-15 MED ORDER — PROPOFOL 500 MG/50ML IV EMUL
INTRAVENOUS | Status: AC
  Filled 2018-01-15: qty 50

## 2018-01-15 MED ORDER — SODIUM CHLORIDE 0.9 % IV SOLN
INTRAVENOUS | Status: DC
  Administered 2018-01-15: 09:00:00 via INTRAVENOUS

## 2018-01-15 MED ORDER — PHENYLEPHRINE HCL 10 MG/ML IJ SOLN
INTRAMUSCULAR | Status: DC | PRN
  Administered 2018-01-15: 50 ug via INTRAVENOUS

## 2018-01-15 MED ORDER — PROPOFOL 500 MG/50ML IV EMUL
INTRAVENOUS | Status: DC | PRN
  Administered 2018-01-15: 100 ug/kg/min via INTRAVENOUS

## 2018-01-15 MED ORDER — FENTANYL CITRATE (PF) 100 MCG/2ML IJ SOLN
INTRAMUSCULAR | Status: DC | PRN
  Administered 2018-01-15: 20 ug via INTRAVENOUS
  Administered 2018-01-15: 50 ug via INTRAVENOUS
  Administered 2018-01-15: 30 ug via INTRAVENOUS

## 2018-01-15 NOTE — Transfer of Care (Signed)
Immediate Anesthesia Transfer of Care Note  Patient: MITZY NARON  Procedure(s) Performed: COLONOSCOPY WITH PROPOFOL (N/A )  Patient Location: Endoscopy Unit  Anesthesia Type:General  Level of Consciousness: awake  Airway & Oxygen Therapy: Patient Spontanous Breathing and Patient connected to nasal cannula oxygen  Post-op Assessment: Report given to RN and Post -op Vital signs reviewed and stable  Post vital signs: stable  Last Vitals:  Vitals Value Taken Time  BP 114/71 01/15/2018  9:41 AM  Temp 36.1 C 01/15/2018  9:40 AM  Pulse 96 01/15/2018  9:42 AM  Resp 19 01/15/2018  9:42 AM  SpO2 100 % 01/15/2018  9:42 AM  Vitals shown include unvalidated device data.  Last Pain:  Vitals:   01/15/18 0940  TempSrc: Tympanic  PainSc: 0-No pain         Complications: No apparent anesthesia complications

## 2018-01-15 NOTE — Op Note (Signed)
[facility] Gastroenterology Patient Name: Miranda Evans Procedure Date: 01/15/2018 9:04 AM MRN: [id number] Account #: [id number] Date of Birth: 01-27-1960 Admit Type: Outpatient Age: 58 Room: [facility] ENDO ROOM 2 Gender: Female Note Status: Finalized Procedure:            Colonoscopy Indications:          Screening in patient at increased risk: Colorectal                        cancer in brother before age 21, This is the patient's                        first colonoscopy Providers:            Lin Landsman MD, MD Referring MD:         Angela Adam. Caryl Bis (Referring MD) Medicines:            Monitored Anesthesia Care Complications:        No immediate complications. Estimated blood loss: None. Procedure:            Pre-Anesthesia Assessment:                       - Prior to the procedure, a History and Physical was                        performed, and patient medications and allergies were                        reviewed. The patient is competent. The risks and                        benefits of the procedure and the sedation options and                        risks were discussed with the patient. All questions                        were answered and informed consent was obtained.                        Patient identification and proposed procedure were                        verified by the physician, the nurse, the                        anesthesiologist, the anesthetist and the technician in                        the pre-procedure area in the procedure room in the                        endoscopy suite. Mental Status Examination: alert and                        oriented. Airway Examination: normal oropharyngeal                        airway and neck mobility. Respiratory Examination:  clear to auscultation. CV Examination: normal.                        Prophylactic Antibiotics: The patient does not require   prophylactic antibiotics. Prior Anticoagulants: The                        patient has taken no previous anticoagulant or                        antiplatelet agents. ASA Grade Assessment: III - A                        patient with severe systemic disease. After reviewing                        the risks and benefits, the patient was deemed in                        satisfactory condition to undergo the procedure. The                        anesthesia plan was to use monitored anesthesia care                        (MAC). Immediately prior to administration of                        medications, the patient was re-assessed for adequacy                        to receive sedatives. The heart rate, respiratory rate,                        oxygen saturations, blood pressure, adequacy of                        pulmonary ventilation, and response to care were                        monitored throughout the procedure. The physical status                        of the patient was re-assessed after the procedure.                       After obtaining informed consent, the colonoscope was                        passed under direct vision. Throughout the procedure,                        the patient's blood pressure, pulse, and oxygen                        saturations were monitored continuously. The                        Colonoscope was introduced through the anus and  advanced to the the terminal ileum, with identification                        of the appendiceal orifice and IC valve. The                        colonoscopy was performed without difficulty. The                        patient tolerated the procedure well. The quality of                        the bowel preparation was evaluated using the BBPS                        [facility] Bowel Preparation Scale) with scores of: Right                        Colon = 3, Transverse Colon = 3 and Left Colon = 3                         (entire mucosa seen well with no residual staining,                        small fragments of stool or opaque liquid). The total                        BBPS score equals 9. Findings:      Hemorrhoids were found on perianal exam.      The terminal ileum appeared normal.      A 5 mm polyp was found in the sigmoid colon. The polyp was hyperplastic.       The polyp was removed with a cold snare. Resection and retrieval were       complete.      A single diverticulum was found in the cecum.      Non-bleeding external and internal hemorrhoids were found during       retroflexion. The hemorrhoids were medium-sized. Estimated blood loss:       none.      No additional abnormalities were found on retroflexion. Impression:           - Hemorrhoids found on perianal exam.                       - The examined portion of the ileum was normal.                       - One 5 mm polyp in the sigmoid colon, removed with a                        cold snare. Resected and retrieved.                       - Diverticulosis in the cecum.                       - Non-bleeding external and internal hemorrhoids. Recommendation:       - Discharge patient to home (with escort).                       -  Resume previous diet today.                       - Continue present medications.                       - Await pathology results.                       - Repeat colonoscopy in 5 years for surveillance given                        family h/o colon cancer in 1st degree relative. Procedure Code(s):    --- Professional ---                       [phone], Colonoscopy, flexible; with removal of tumor(s),                        polyp(s), or other lesion(s) by snare technique Diagnosis Code(s):    --- Professional ---                       Z80.0, Family history of malignant neoplasm of                        digestive organs                       K64.8, Other hemorrhoids                       D12.5, Benign neoplasm of  sigmoid colon                       K57.30, Diverticulosis of large intestine without                        perforation or abscess without bleeding CPT copyright 2018 American Medical Association. All rights reserved. The codes documented in this report are preliminary and upon coder review may  be revised to meet current compliance requirements. Dr. Ulyess Mort Lin Landsman MD, MD 01/15/2018 9:37:07 AM This report has been signed electronically. Number of Addenda: 0 Note Initiated On: 01/15/2018 9:04 AM Scope Withdrawal Time: 0 hours 11 minutes 52 seconds  Total Procedure Duration: 0 hours 14 minutes 27 seconds       [facility]

## 2018-01-15 NOTE — Anesthesia Preprocedure Evaluation (Addendum)
Anesthesia Evaluation  Patient identified by MRN, date of birth, ID band Patient awake    Reviewed: Allergy & Precautions, H&P , NPO status , Patient's Chart, lab work & pertinent test results  Airway Mallampati: III       Dental  (+) Teeth Intact   Pulmonary sleep apnea , neg COPD,           Cardiovascular hypertension, (-) anginanegative cardio ROS       Neuro/Psych  Headaches, PSYCHIATRIC DISORDERS Anxiety Depression    GI/Hepatic negative GI ROS, Neg liver ROS,   Endo/Other  negative endocrine ROS  Renal/GU negative Renal ROS  negative genitourinary   Musculoskeletal   Abdominal   Peds  Hematology negative hematology ROS (+)   Anesthesia Other Findings Obese  Past Medical History: No date: Anxiety No date: Depression 07/03/2015: Diarrhea No date: Hypertension No date: Neuromuscular disorder ([place]) No date: Sleep apnea  Past Surgical History: 2007: ABDOMINAL HYSTERECTOMY     Comment:  Partial (has ovaries) 2014: CHOLECYSTECTOMY  BMI    Body Mass Index:  34.93 kg/m      Reproductive/Obstetrics negative OB ROS                            Anesthesia Physical Anesthesia Plan  ASA: III  Anesthesia Plan: General   Post-op Pain Management:    Induction:   PONV Risk Score and Plan: Propofol infusion and TIVA  Airway Management Planned: Natural Airway and Nasal Cannula  Additional Equipment:   Intra-op Plan:   Post-operative Plan:   Informed Consent: I have reviewed the patients History and Physical, chart, labs and discussed the procedure including the risks, benefits and alternatives for the proposed anesthesia with the patient or authorized representative who has indicated his/her understanding and acceptance.   Dental Advisory Given  Plan Discussed with: Anesthesiologist, CRNA and Surgeon  Anesthesia Plan Comments:         Anesthesia Quick Evaluation

## 2018-01-15 NOTE — H&P (Signed)
Cephas Darby, MD [address]  [place]  [place], [place] [address]  Main: [phone]  Fax: [phone] Pager: [phone]  Primary Care Physician:  Leone Haven, MD Primary Gastroenterologist:  Dr. Cephas Darby  Pre-Procedure History & Physical: HPI:  Miranda Evans is a 58 y.o. female is here for an colonoscopy.   Past Medical History:  Diagnosis Date  . Anxiety   . Depression   . Diarrhea 07/03/2015  . Hypertension   . Neuromuscular disorder ([place])   . Sleep apnea     Past Surgical History:  Procedure Laterality Date  . ABDOMINAL HYSTERECTOMY  2007   Partial (has ovaries)  . CHOLECYSTECTOMY  2014    Prior to Admission medications   Medication Sig Start Date End Date Taking? Authorizing Provider  amitriptyline (ELAVIL) 50 MG tablet TAKE 1 TABLET(50 MG) BY MOUTH AT BEDTIME 12/20/17  Yes Chi Woodham, Tally Due, MD  amLODipine (NORVASC) 5 MG tablet Take 1 tablet (5 mg total) by mouth daily. 11/14/17  Yes Leone Haven, MD  gabapentin (NEURONTIN) 100 MG capsule Take 1 capsule (100 mg total) by mouth 3 (three) times daily. 07/04/17  Yes Leone Haven, MD  methocarbamol (ROBAXIN) 500 MG tablet Take 1 tablet (500 mg total) by mouth every 6 (six) hours as needed for muscle spasms. 06/26/17  Yes Leone Haven, MD  Misc. Devices (ALL-BODY MASSAGE) MISC 1 application by Does not apply route every 14 (fourteen) days. 04/22/15   Rubbie Battiest, RN    Allergies as of 12/21/2017  . (No Known Allergies)    Family History  Problem Relation Age of Onset  . Hyperlipidemia Mother   . Hypertension Mother   . Diabetes Father   . Cancer Brother        Colon Cancer  . Cancer Maternal Aunt        Ovary/Uterus/Breast  . Hyperlipidemia Maternal Aunt   . Hypertension Maternal Aunt   . Diabetes Maternal Aunt   . Cancer Maternal Uncle        Colon Cancer  . Diabetes Maternal Uncle   . Cancer Paternal Aunt        Breast Cancer (2 Aunts)  . Diabetes Paternal  Aunt   . Cancer Paternal Uncle        Colon Cancer  . Diabetes Paternal Uncle     Social History   Socioeconomic History  . Marital status: Married    Spouse name: Not on file  . Number of children: Not on file  . Years of education: Not on file  . Highest education level: Not on file  Occupational History  . Not on file  Social Needs  . Financial resource strain: Not on file  . Food insecurity:    Worry: Not on file    Inability: Not on file  . Transportation needs:    Medical: Not on file    Non-medical: Not on file  Tobacco Use  . Smoking status: Never Smoker  . Smokeless tobacco: Never Used  Substance and Sexual Activity  . Alcohol use: Yes    Alcohol/week: 0.0 standard drinks    Comment: Rare   . Drug use: No  . Sexual activity: Yes    Partners: Male    Birth control/protection: Surgical    Comment: Husband  Lifestyle  . Physical activity:    Days per week: Not on file    Minutes per session: Not on file  . Stress: Not on file  Relationships  . Social connections:    Talks on phone: Not on file    Gets together: Not on file    Attends religious service: Not on file    Active member of club or organization: Not on file    Attends meetings of clubs or organizations: Not on file    Relationship status: Not on file  . Intimate partner violence:    Fear of current or ex partner: Not on file    Emotionally abused: Not on file    Physically abused: Not on file    Forced sexual activity: Not on file  Other Topics Concern  . Not on file  Social History Narrative   Work: Microbiologist; Pharmacist, community    Pets: none   Children- None    Caffeine- Soda 1 cup daily; no coffee/tea     Review of Systems: See HPI, otherwise negative ROS  Physical Exam: BP 134/88   Pulse 98   Temp (!) 96.8 F (36 C) (Tympanic)   Resp 18   Ht 5\' 7"  (1.702 m)   Wt 101.2 kg   LMP  (LMP Unknown)   SpO2 99%   BMI 34.93 kg/m  General:   Alert,  pleasant and  cooperative in NAD Head:  Normocephalic and atraumatic. Neck:  Supple; no masses or thyromegaly. Lungs:  Clear throughout to auscultation.    Heart:  Regular rate and rhythm. Abdomen:  Soft, nontender and nondistended. Normal bowel sounds, without guarding, and without rebound.   Neurologic:  Alert and  oriented x4;  grossly normal neurologically.  Impression/Plan: Miranda Evans is here for an colonoscopy to be performed for colon cancer screening  Risks, benefits, limitations, and alternatives regarding  colonoscopy have been reviewed with the patient.  Questions have been answered.  All parties agreeable.   Sherri Sear, MD  01/15/2018, 8:55 AM

## 2018-01-15 NOTE — Anesthesia Post-op Follow-up Note (Signed)
Anesthesia QCDR form completed.        

## 2018-01-15 NOTE — Anesthesia Postprocedure Evaluation (Signed)
Anesthesia Post Note  Patient: Miranda Evans  Procedure(s) Performed: COLONOSCOPY WITH PROPOFOL (N/A )  Patient location during evaluation: PACU Anesthesia Type: General Level of consciousness: awake and alert Pain management: pain level controlled Vital Signs Assessment: post-procedure vital signs reviewed and stable Respiratory status: spontaneous breathing, nonlabored ventilation and respiratory function stable Cardiovascular status: blood pressure returned to baseline and stable Postop Assessment: no apparent nausea or vomiting Anesthetic complications: no     Last Vitals:  Vitals:   01/15/18 1010 01/15/18 1014  BP: 103/67 126/85  Pulse:    Resp:    Temp:    SpO2:      Last Pain:  Vitals:   01/15/18 1028  TempSrc:   PainSc: 0-No pain                 Durenda Hurt

## 2018-01-16 ENCOUNTER — Encounter: Payer: Self-pay | Admitting: Gastroenterology

## 2018-01-16 LAB — SURGICAL PATHOLOGY

## 2018-01-17 ENCOUNTER — Encounter: Payer: Self-pay | Admitting: Gastroenterology

## 2018-01-17 ENCOUNTER — Ambulatory Visit (INDEPENDENT_AMBULATORY_CARE_PROVIDER_SITE_OTHER): Payer: Self-pay | Admitting: Family Medicine

## 2018-01-17 ENCOUNTER — Telehealth: Payer: Self-pay | Admitting: Family Medicine

## 2018-01-17 VITALS — BP 120/82 | HR 89 | Temp 98.2°F | Ht 67.0 in | Wt 226.0 lb

## 2018-01-17 DIAGNOSIS — G8929 Other chronic pain: Secondary | ICD-10-CM

## 2018-01-17 DIAGNOSIS — F329 Major depressive disorder, single episode, unspecified: Secondary | ICD-10-CM

## 2018-01-17 DIAGNOSIS — K580 Irritable bowel syndrome with diarrhea: Secondary | ICD-10-CM

## 2018-01-17 DIAGNOSIS — M5441 Lumbago with sciatica, right side: Secondary | ICD-10-CM

## 2018-01-17 DIAGNOSIS — K589 Irritable bowel syndrome without diarrhea: Secondary | ICD-10-CM | POA: Insufficient documentation

## 2018-01-17 DIAGNOSIS — F419 Anxiety disorder, unspecified: Secondary | ICD-10-CM

## 2018-01-17 DIAGNOSIS — I10 Essential (primary) hypertension: Secondary | ICD-10-CM

## 2018-01-17 LAB — LIPID PANEL
CHOLESTEROL: 238 mg/dL — AB (ref 0–200)
HDL: 45.1 mg/dL (ref >39.00)
LDL Cholesterol: 169 mg/dL — ABNORMAL HIGH (ref 0–99)
NonHDL: 193.29
TRIGLYCERIDES: 123 mg/dL (ref 0.0–149.0)
Total CHOL/HDL Ratio: 5
VLDL: 24.6 mg/dL (ref 0.0–40.0)

## 2018-01-17 LAB — COMPREHENSIVE METABOLIC PANEL
ALT: 21 U/L (ref 0–35)
AST: 13 U/L (ref 0–37)
Albumin: 3.9 g/dL (ref 3.5–5.2)
Alkaline Phosphatase: 72 U/L (ref 39–117)
BUN: 10 mg/dL (ref 6–23)
CALCIUM: 9.3 mg/dL (ref 8.4–10.5)
CO2: 30 mEq/L (ref 19–32)
Chloride: 103 mEq/L (ref 96–112)
Creatinine, Ser: 0.68 mg/dL (ref 0.40–1.20)
GFR: 115.25 mL/min (ref >60.00)
Glucose, Bld: 95 mg/dL (ref 70–99)
Potassium: 4.1 mEq/L (ref 3.5–5.1)
Sodium: 139 mEq/L (ref 135–145)
Total Bilirubin: 0.6 mg/dL (ref 0.2–1.2)
Total Protein: 7.2 g/dL (ref 6.0–8.3)

## 2018-01-17 NOTE — Assessment & Plan Note (Signed)
Well-controlled.  Continue current medication.  Check CMP and lipid panel.

## 2018-01-17 NOTE — Assessment & Plan Note (Signed)
Chronic issue.  She will continue gabapentin.  She will monitor.  Discussed possibly seeing a specialist when she gets insurance.
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
## 2018-01-17 NOTE — Assessment & Plan Note (Signed)
Much improved.  She will continue Elavil.  She will monitor.

## 2018-01-17 NOTE — Telephone Encounter (Signed)
Please let the patient know that after she left the office I was reviewing her chart and noted that she had a scan of her chest in 2013 that revealed a lung nodule.  They recommended a follow-up scan of this to determine if it had changed in size.  I would like to order this follow-up imaging study to reevaluate this.  Please let her know that most lung nodules are noncancerous though should be reevaluated until deemed noncancerous by radiology.  If she is willing to do this at this time I can place an order.  If she would prefer to wait until she obtains insurance we can complete it at that time.

## 2018-01-17 NOTE — Assessment & Plan Note (Signed)
Much improved on Elavil.  She will continue to monitor.

## 2018-01-17 NOTE — Patient Instructions (Signed)
Nice to see you. We will check lab work today and contact you with the results. 

## 2018-01-18 ENCOUNTER — Other Ambulatory Visit: Payer: Self-pay | Admitting: Family Medicine

## 2018-01-18 DIAGNOSIS — E785 Hyperlipidemia, unspecified: Secondary | ICD-10-CM

## 2018-01-18 MED ORDER — ATORVASTATIN CALCIUM 40 MG PO TABS: 40.0000 mg | ORAL_TABLET | Freq: Every day | ORAL | 3 refills | Status: DC

## 2018-01-18 NOTE — Telephone Encounter (Signed)
Called and spoke with patient. Pt stated that she will try and get the insurance situation figured out and if she can't she will call us to have [place] schedule it and they just bill her however, she would like to seek insurance first.   Thanks   Sent to PCP as an [place]

## 2018-01-18 NOTE — Telephone Encounter (Signed)
Noted  

## 2018-01-25 ENCOUNTER — Other Ambulatory Visit: Payer: Self-pay | Admitting: Gastroenterology

## 2018-01-25 DIAGNOSIS — K580 Irritable bowel syndrome with diarrhea: Secondary | ICD-10-CM

## 2018-02-19 ENCOUNTER — Other Ambulatory Visit (INDEPENDENT_AMBULATORY_CARE_PROVIDER_SITE_OTHER): Payer: Self-pay

## 2018-02-19 DIAGNOSIS — E785 Hyperlipidemia, unspecified: Secondary | ICD-10-CM

## 2018-02-19 LAB — HEPATIC FUNCTION PANEL
ALBUMIN: 4.2 g/dL (ref 3.5–5.2)
ALT: 18 U/L (ref 0–35)
AST: 12 U/L (ref 0–37)
Alkaline Phosphatase: 93 U/L (ref 39–117)
Bilirubin, Direct: 0.1 mg/dL (ref 0.0–0.3)
Total Bilirubin: 0.6 mg/dL (ref 0.2–1.2)
Total Protein: 7.8 g/dL (ref 6.0–8.3)

## 2018-02-19 LAB — LDL CHOLESTEROL, DIRECT: Direct LDL: 119 mg/dL

## 2018-07-18 ENCOUNTER — Ambulatory Visit (INDEPENDENT_AMBULATORY_CARE_PROVIDER_SITE_OTHER): Payer: Self-pay | Admitting: Family Medicine

## 2018-07-18 ENCOUNTER — Other Ambulatory Visit: Payer: Self-pay

## 2018-07-18 DIAGNOSIS — E785 Hyperlipidemia, unspecified: Secondary | ICD-10-CM

## 2018-07-18 DIAGNOSIS — M47812 Spondylosis without myelopathy or radiculopathy, cervical region: Secondary | ICD-10-CM

## 2018-07-18 DIAGNOSIS — F32A Depression, unspecified: Secondary | ICD-10-CM

## 2018-07-18 DIAGNOSIS — K580 Irritable bowel syndrome with diarrhea: Secondary | ICD-10-CM

## 2018-07-18 DIAGNOSIS — I10 Essential (primary) hypertension: Secondary | ICD-10-CM

## 2018-07-18 DIAGNOSIS — F329 Major depressive disorder, single episode, unspecified: Secondary | ICD-10-CM

## 2018-07-18 DIAGNOSIS — F419 Anxiety disorder, unspecified: Secondary | ICD-10-CM

## 2018-07-18 MED ORDER — ATORVASTATIN CALCIUM 40 MG PO TABS: 40.0000 mg | ORAL_TABLET | Freq: Every day | ORAL | 3 refills | Status: DC
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
## 2018-07-22 DIAGNOSIS — E785 Hyperlipidemia, unspecified: Secondary | ICD-10-CM | POA: Insufficient documentation

## 2018-07-22 NOTE — Assessment & Plan Note (Signed)
Asymptomatic currently.  Continue Elavil.

## 2018-07-22 NOTE — Assessment & Plan Note (Signed)
Adequately controlled. Continue to monitor. 

## 2018-07-22 NOTE — Assessment & Plan Note (Signed)
Stable.  Continue current medication

## 2018-07-22 NOTE — Assessment & Plan Note (Signed)
Patient has chronic stable symptoms related to this.  We will refer her to physical medicine rehab to get another opinion.

## 2018-07-22 NOTE — Assessment & Plan Note (Signed)
Patient with intermediate ASCVD risk.  She is willing to restart the Lipitor.  This was sent to her pharmacy.  Plan for labs in 6 to 8 weeks.
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
## 2018-09-17 ENCOUNTER — Other Ambulatory Visit: Payer: Self-pay | Admitting: Family Medicine

## 2018-12-18 ENCOUNTER — Other Ambulatory Visit: Payer: Self-pay

## 2018-12-18 MED ORDER — AMLODIPINE BESYLATE 5 MG PO TABS: 5.0000 mg | ORAL_TABLET | Freq: Every day | ORAL | 0 refills | Status: DC

## 2018-12-18 NOTE — Telephone Encounter (Signed)
I received an fax from [facility] for a refill on amlodipine , I called and scheduled an appointment with the patient and verified the pharmacy and I refilled the medication and sen to pharmacy.  Sparrow Sanzo,cma

## 2019-01-16 ENCOUNTER — Ambulatory Visit (INDEPENDENT_AMBULATORY_CARE_PROVIDER_SITE_OTHER): Payer: Self-pay | Admitting: Family Medicine

## 2019-01-16 ENCOUNTER — Other Ambulatory Visit: Payer: Self-pay

## 2019-01-16 ENCOUNTER — Encounter: Payer: Self-pay | Admitting: Family Medicine

## 2019-01-16 VITALS — Ht 67.0 in | Wt 234.0 lb

## 2019-01-16 DIAGNOSIS — F329 Major depressive disorder, single episode, unspecified: Secondary | ICD-10-CM

## 2019-01-16 DIAGNOSIS — E785 Hyperlipidemia, unspecified: Secondary | ICD-10-CM

## 2019-01-16 DIAGNOSIS — I10 Essential (primary) hypertension: Secondary | ICD-10-CM

## 2019-01-16 DIAGNOSIS — Z114 Encounter for screening for human immunodeficiency virus [HIV]: Secondary | ICD-10-CM

## 2019-01-16 DIAGNOSIS — Z1159 Encounter for screening for other viral diseases: Secondary | ICD-10-CM

## 2019-01-16 DIAGNOSIS — M47812 Spondylosis without myelopathy or radiculopathy, cervical region: Secondary | ICD-10-CM

## 2019-01-16 DIAGNOSIS — F419 Anxiety disorder, unspecified: Secondary | ICD-10-CM

## 2019-01-16 DIAGNOSIS — Z1231 Encounter for screening mammogram for malignant neoplasm of breast: Secondary | ICD-10-CM

## 2019-01-16 NOTE — Assessment & Plan Note (Signed)
Well-controlled.  Continue amlodipine.  Lab work in 3 months.

## 2019-01-16 NOTE — Assessment & Plan Note (Signed)
Stable.  I discussed that she would benefit from seeing physical medicine and rehab though she does not currently have insurance.  She will let us know when she is able to see them.

## 2019-01-16 NOTE — Assessment & Plan Note (Signed)
Adequately controlled.  She will monitor.  Encouraged continued social distancing and mask wearing when she is out of the house.

## 2019-01-16 NOTE — Assessment & Plan Note (Signed)
Continue Lipitor.  Plan to check labs in 3 months.

## 2019-01-16 NOTE — Progress Notes (Signed)
Virtual Visit via telephone Note  This visit type was conducted due to national recommendations for restrictions regarding the COVID-19 pandemic (e.g. social distancing).  This format is felt to be most appropriate for this patient at this time.  All issues noted in this document were discussed and addressed.  No physical exam was performed (except for noted visual exam findings with Video Visits).   I connected with Miranda Evans today at  1:15 PM EST by telephone and verified that I am speaking with the correct person using two identifiers. Location patient: home Location provider: work Persons participating in the virtual visit: patient, provider  I discussed the limitations, risks, security and privacy concerns of performing an evaluation and management service by telephone and the availability of in person appointments. I also discussed with the patient that there may be a patient responsible charge related to this service. The patient expressed understanding and agreed to proceed.  Interactive audio and video telecommunications were attempted between this provider and patient, however failed, due to patient having technical difficulties OR patient did not have access to video capability.  We continued and completed visit with audio only.   Reason for visit: follow-up  HPI: HYPERTENSION  Disease Monitoring  Home BP Monitoring 110/66 this am, 129/88 the most recent Chest pain- no    Dyspnea- no Medications  Compliance-  amlodipine. Lightheadedness-  no  Edema- no  Anxiety/depression: Patient notes her anxiety depends on the day.  Depends on if she has to go out and be around other people that are not wearing masks.  She notes it is under pretty good control.  No depression.  Left-sided pain: This continues to be an issue.  Is relatively stable.  She notes that not being active does contribute to this.  Gabapentin is somewhat helpful.  She has chronic occasional numbness in her leg if  she is sitting.  She does have some back pain.  She does note some chronic left arm weakness depending on what she picks up.  Hyperlipidemia: No abdominal pain or myalgias.  She is taking Lipitor.   ROS: See pertinent positives and negatives per HPI.  Past Medical History:  Diagnosis Date  . Anxiety   . Depression   . Diarrhea 07/03/2015  . Hypertension   . Neuromuscular disorder ([place])   . Sleep apnea     Past Surgical History:  Procedure Laterality Date  . ABDOMINAL HYSTERECTOMY  2007   Partial (has ovaries)  . CHOLECYSTECTOMY  2014  . COLONOSCOPY WITH PROPOFOL N/A 01/15/2018   Procedure: COLONOSCOPY WITH PROPOFOL;  Surgeon: Lin Landsman, MD;  Location[facility] ENDOSCOPY;  Service: Gastroenterology;  Laterality: N/A;    Family History  Problem Relation Age of Onset  . Hyperlipidemia Mother   . Hypertension Mother   . Diabetes Father   . Cancer Brother        Colon Cancer  . Cancer Maternal Aunt        Ovary/Uterus/Breast  . Hyperlipidemia Maternal Aunt   . Hypertension Maternal Aunt   . Diabetes Maternal Aunt   . Cancer Maternal Uncle        Colon Cancer  . Diabetes Maternal Uncle   . Cancer Paternal Aunt        Breast Cancer (2 Aunts)  . Diabetes Paternal Aunt   . Cancer Paternal Uncle        Colon Cancer  . Diabetes Paternal Uncle     SOCIAL HX: Non-smoker   Current Outpatient  Medications:  .  amLODipine (NORVASC) 5 MG tablet, Take 1 tablet (5 mg total) by mouth daily., Disp: 90 tablet, Rfl: 0 .  atorvastatin (LIPITOR) 40 MG tablet, Take 1 tablet (40 mg total) by mouth daily., Disp: 90 tablet, Rfl: 3 .  gabapentin (NEURONTIN) 100 MG capsule, TAKE 1 CAPSULE BY MOUTH THREE TIMES DAILY, Disp: 90 capsule, Rfl: 3 .  methocarbamol (ROBAXIN) 500 MG tablet, Take 1 tablet (500 mg total) by mouth every 6 (six) hours as needed for muscle spasms., Disp: 60 tablet, Rfl: 2 .  Misc. Devices (ALL-BODY MASSAGE) MISC, 1 application by Does not apply route every 14  (fourteen) days., Disp: 1 each, Rfl: 2  EXAM: This was a telehealth telephone visit and thus no physical exam was completed.  ASSESSMENT AND PLAN:  Discussed the following assessment and plan:  Cervical spondylosis without myelopathy Stable.  I discussed that she would benefit from seeing physical medicine and rehab though she does not currently have insurance.  She will let us know when she is able to see them.  HTN (hypertension) Well-controlled.  Continue amlodipine.  Lab work in 3 months.  Anxiety and depression Adequately controlled.  She will monitor.  Encouraged continued social distancing and mask wearing when she is out of the house.  Hyperlipidemia Continue Lipitor.  Plan to check labs in 3 months.   Health maintenance: Mammogram ordered.  She will call to schedule and find out if it is affordable for her without insurance.  We plan on obtaining HIV and hepatitis C screening.  She declines flu vaccine.  She will consider tetanus vaccine.   Orders Placed This Encounter  Procedures  . MM 3D SCREEN BREAST BILATERAL    Standing Status:   Future    Standing Expiration Date:   03/15/2020    Order Specific Question:   Reason for Exam (SYMPTOM  OR DIAGNOSIS REQUIRED)    Answer:   breast cancer screening    Order Specific Question:   Is the patient pregnant?    Answer:   No    Order Specific Question:   Preferred imaging location?    Answer:   [facility]  . Comp Met (CMET)    Standing Status:   Future    Standing Expiration Date:   01/16/2020  . Hepatitis C Antibody    Standing Status:   Future    Standing Expiration Date:   01/16/2020  . Lipid panel    Standing Status:   Future    Standing Expiration Date:   01/16/2020  . Hemoglobin A1c    Standing Status:   Future    Standing Expiration Date:   01/16/2020  . HIV antibody (with reflex)    Standing Status:   Future    Standing Expiration Date:   01/16/2020    No orders of the defined types were placed in this  encounter.    I discussed the assessment and treatment plan with the patient. The patient was provided an opportunity to ask questions and all were answered. The patient agreed with the plan and demonstrated an understanding of the instructions.   The patient was advised to call back or seek an in-person evaluation if the symptoms worsen or if the condition fails to improve as anticipated.  I provided 23 minutes of non-face-to-face time during this encounter.   Tommi Rumps, MD

## 2019-01-29 ENCOUNTER — Encounter: Payer: Self-pay | Admitting: Family Medicine

## 2019-01-30 ENCOUNTER — Encounter: Payer: Self-pay | Admitting: Family Medicine

## 2019-03-14 ENCOUNTER — Other Ambulatory Visit: Payer: Self-pay

## 2019-03-14 ENCOUNTER — Other Ambulatory Visit: Payer: Self-pay | Admitting: Family Medicine

## 2019-03-16 ENCOUNTER — Other Ambulatory Visit: Payer: Self-pay | Admitting: Family Medicine

## 2019-03-22 ENCOUNTER — Ambulatory Visit: Payer: Self-pay | Attending: Internal Medicine

## 2019-03-22 DIAGNOSIS — Z23 Encounter for immunization: Secondary | ICD-10-CM

## 2019-03-22 NOTE — Progress Notes (Signed)
   Covid-19 Vaccination Clinic  Name:  Miranda Evans    MRN: [id number] DOB: December 24, 1960  03/22/2019  [name] was observed post Covid-19 immunization for 15 minutes without incident. She was provided with Vaccine Information Sheet and instruction to access the V-Safe system.   [name] was instructed to call 911 with any severe reactions post vaccine: Marland Kitchen Difficulty breathing  . Swelling of face and throat  . A fast heartbeat  . A bad rash all over body  . Dizziness and weakness   Immunizations Administered    Name Date Dose VIS Date Route   Pfizer COVID-19 Vaccine 03/22/2019  4:49 PM 0.3 mL 12/14/2018 Intramuscular   Manufacturer: [place]   Lot: [id number]   [place]: [id number]

## 2019-04-12 ENCOUNTER — Ambulatory Visit: Payer: Self-pay | Attending: Internal Medicine

## 2019-04-12 DIAGNOSIS — Z23 Encounter for immunization: Secondary | ICD-10-CM

## 2019-04-12 NOTE — Progress Notes (Signed)
   Covid-19 Vaccination Clinic  Name:  Miranda Evans    MRN: [id number] DOB: 02-Apr-1960  04/12/2019  [name] was observed post Covid-19 immunization for 15 minutes without incident. She was provided with Vaccine Information Sheet and instruction to access the V-Safe system.   [name] was instructed to call 911 with any severe reactions post vaccine: Marland Kitchen Difficulty breathing  . Swelling of face and throat  . A fast heartbeat  . A bad rash all over body  . Dizziness and weakness   Immunizations Administered    Name Date Dose VIS Date Route   Pfizer COVID-19 Vaccine 04/12/2019  3:57 PM 0.3 mL 12/14/2018 Intramuscular   Manufacturer: [place]   Lot: [id number]   [place]: [id number]

## 2019-04-16 ENCOUNTER — Other Ambulatory Visit: Payer: Self-pay

## 2019-04-16 ENCOUNTER — Other Ambulatory Visit (INDEPENDENT_AMBULATORY_CARE_PROVIDER_SITE_OTHER): Payer: Self-pay

## 2019-04-16 ENCOUNTER — Telehealth: Payer: Self-pay

## 2019-04-16 DIAGNOSIS — E785 Hyperlipidemia, unspecified: Secondary | ICD-10-CM

## 2019-04-16 DIAGNOSIS — I10 Essential (primary) hypertension: Secondary | ICD-10-CM

## 2019-04-16 DIAGNOSIS — Z114 Encounter for screening for human immunodeficiency virus [HIV]: Secondary | ICD-10-CM

## 2019-04-16 DIAGNOSIS — Z1159 Encounter for screening for other viral diseases: Secondary | ICD-10-CM

## 2019-04-16 LAB — HEPATIC FUNCTION PANEL
ALT: 25 U/L (ref 0–35)
AST: 16 U/L (ref 0–37)
Albumin: 4.2 g/dL (ref 3.5–5.2)
Alkaline Phosphatase: 93 U/L (ref 39–117)
Bilirubin, Direct: 0.1 mg/dL (ref 0.0–0.3)
Total Bilirubin: 0.8 mg/dL (ref 0.2–1.2)
Total Protein: 8 g/dL (ref 6.0–8.3)

## 2019-04-16 LAB — LIPID PANEL
Cholesterol: 166 mg/dL (ref 0–200)
HDL: 46.2 mg/dL (ref >39.00)
LDL Cholesterol: 103 mg/dL — ABNORMAL HIGH (ref 0–99)
NonHDL: 119.99
Total CHOL/HDL Ratio: 4
Triglycerides: 84 mg/dL (ref 0.0–149.0)
VLDL: 16.8 mg/dL (ref 0.0–40.0)

## 2019-04-16 LAB — COMPREHENSIVE METABOLIC PANEL
ALT: 25 U/L (ref 0–35)
AST: 16 U/L (ref 0–37)
Albumin: 4.2 g/dL (ref 3.5–5.2)
Alkaline Phosphatase: 93 U/L (ref 39–117)
BUN: 15 mg/dL (ref 6–23)
CO2: 29 mEq/L (ref 19–32)
Calcium: 9.7 mg/dL (ref 8.4–10.5)
Chloride: 103 mEq/L (ref 96–112)
Creatinine, Ser: 0.72 mg/dL (ref 0.40–1.20)
GFR: 101.06 mL/min (ref >60.00)
Glucose, Bld: 110 mg/dL — ABNORMAL HIGH (ref 70–99)
Potassium: 4 mEq/L (ref 3.5–5.1)
Sodium: 141 mEq/L (ref 135–145)
Total Bilirubin: 0.8 mg/dL (ref 0.2–1.2)
Total Protein: 8 g/dL (ref 6.0–8.3)

## 2019-04-16 LAB — LDL CHOLESTEROL, DIRECT: Direct LDL: 100 mg/dL

## 2019-04-16 LAB — HEMOGLOBIN A1C: Hgb A1c MFr Bld: 6.5 % (ref 4.6–6.5)

## 2019-04-16 NOTE — Telephone Encounter (Signed)
She is going to have to complete another visit to discuss potential restrictions. We did not discuss this during her most recent visit.

## 2019-04-16 NOTE — Addendum Note (Signed)
Addended by: Leeanne Rio on: 04/16/2019 03:16 PM   Modules accepted: Orders

## 2019-04-16 NOTE — Telephone Encounter (Signed)
I called and scheduled the patient for a visit tomorrow to see the provider about her restrictions.  Darely Becknell,cma

## 2019-04-17 ENCOUNTER — Ambulatory Visit (INDEPENDENT_AMBULATORY_CARE_PROVIDER_SITE_OTHER): Payer: Self-pay | Admitting: Family Medicine

## 2019-04-17 ENCOUNTER — Other Ambulatory Visit: Payer: Self-pay

## 2019-04-17 VITALS — BP 120/80 | HR 98 | Temp 96.5°F | Ht 67.0 in | Wt 237.0 lb

## 2019-04-17 DIAGNOSIS — M545 Low back pain, unspecified: Secondary | ICD-10-CM

## 2019-04-17 DIAGNOSIS — E785 Hyperlipidemia, unspecified: Secondary | ICD-10-CM

## 2019-04-17 DIAGNOSIS — F329 Major depressive disorder, single episode, unspecified: Secondary | ICD-10-CM

## 2019-04-17 DIAGNOSIS — E669 Obesity, unspecified: Secondary | ICD-10-CM | POA: Insufficient documentation

## 2019-04-17 DIAGNOSIS — F419 Anxiety disorder, unspecified: Secondary | ICD-10-CM

## 2019-04-17 DIAGNOSIS — M25512 Pain in left shoulder: Secondary | ICD-10-CM

## 2019-04-17 DIAGNOSIS — R7309 Other abnormal glucose: Secondary | ICD-10-CM | POA: Insufficient documentation

## 2019-04-17 DIAGNOSIS — F321 Major depressive disorder, single episode, moderate: Secondary | ICD-10-CM | POA: Insufficient documentation

## 2019-04-17 DIAGNOSIS — G8929 Other chronic pain: Secondary | ICD-10-CM

## 2019-04-17 DIAGNOSIS — Z6837 Body mass index (BMI) 37.0-37.9, adult: Secondary | ICD-10-CM

## 2019-04-17 LAB — HEPATITIS C ANTIBODY
Hepatitis C Ab: NONREACTIVE
SIGNAL TO CUT-OFF: 0.02 (ref <1.00)

## 2019-04-17 LAB — HIV ANTIBODY (ROUTINE TESTING W REFLEX): HIV 1&2 Ab, 4th Generation: NONREACTIVE

## 2019-04-17 MED ORDER — DULOXETINE HCL 30 MG PO CPEP: ORAL_CAPSULE | ORAL | 1 refills | Status: DC
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
## 2019-04-17 NOTE — Assessment & Plan Note (Signed)
Encouraged activity and dietary changes.  Plan to recheck in 3 months.

## 2019-04-17 NOTE — Assessment & Plan Note (Signed)
Generally well controlled.  Continue Lipitor.  She will work on diet and exercise.

## 2019-04-17 NOTE — Assessment & Plan Note (Signed)
Resolved

## 2019-04-17 NOTE — Assessment & Plan Note (Addendum)
Having depression issues.  We will start on Cymbalta.  Discussed the potential for keeping her awake at night and drowsiness.  Also discussed the potential for sexual dysfunction.  Advised if she developed active suicidal thoughts she would need to seek medical attention in the emergency department.  Also discussed that it could take 6 to 8 weeks for her to notice any difference with the Cymbalta.  Follow-up in 6 weeks.

## 2019-04-17 NOTE — Assessment & Plan Note (Signed)
See anxiety and depression for plan.

## 2019-04-17 NOTE — Assessment & Plan Note (Signed)
This continues to be an issue.  I previously wanted her to see physiatry to get a second opinion though she was unable to see them given lack of insurance.  She was provided patient assistance paperwork today.  She continues to have the same restrictions being able to work as she had previously and an updated letter was provided to her.  She is currently looking for jobs.  We will see if the Cymbalta helps with this.

## 2019-04-17 NOTE — Assessment & Plan Note (Signed)
Encouraged activity as she is able.  Discussed decreasing junk food.

## 2019-04-17 NOTE — Patient Instructions (Signed)
Nice to see you. We will start cymbalta and see if that helps with depression and pain. Please try to apply for patient assistance.  Please work on diet and exercise.

## 2019-05-27 ENCOUNTER — Ambulatory Visit
Admission: RE | Admit: 2019-05-27 | Discharge: 2019-05-27 | Disposition: A | Discharge status: Home / Self Care | Payer: Self-pay | Source: Ambulatory Visit | Attending: Family Medicine | Admitting: Family Medicine

## 2019-05-27 DIAGNOSIS — Z1231 Encounter for screening mammogram for malignant neoplasm of breast: Secondary | ICD-10-CM | POA: Insufficient documentation

## 2019-05-29 ENCOUNTER — Encounter: Payer: Self-pay | Admitting: Family Medicine

## 2019-05-29 ENCOUNTER — Other Ambulatory Visit: Payer: Self-pay

## 2019-05-29 ENCOUNTER — Telehealth: Payer: Self-pay | Admitting: Family Medicine

## 2019-05-29 ENCOUNTER — Ambulatory Visit (INDEPENDENT_AMBULATORY_CARE_PROVIDER_SITE_OTHER): Payer: Self-pay | Admitting: Family Medicine

## 2019-05-29 ENCOUNTER — Telehealth: Payer: Self-pay

## 2019-05-29 DIAGNOSIS — G8929 Other chronic pain: Secondary | ICD-10-CM

## 2019-05-29 DIAGNOSIS — M545 Low back pain: Secondary | ICD-10-CM

## 2019-05-29 DIAGNOSIS — F32A Depression, unspecified: Secondary | ICD-10-CM

## 2019-05-29 DIAGNOSIS — F419 Anxiety disorder, unspecified: Secondary | ICD-10-CM

## 2019-05-29 DIAGNOSIS — F329 Major depressive disorder, single episode, unspecified: Secondary | ICD-10-CM

## 2019-05-29 MED ORDER — HYDROXYZINE HCL 25 MG PO TABS: 25.0000 mg | ORAL_TABLET | Freq: Three times a day (TID) | ORAL | 1 refills | Status: DC | PRN

## 2019-05-29 NOTE — Progress Notes (Signed)
Tommi Rumps, MD Phone: [phone]  Miranda Evans is a 59 y.o. female who presents today for f/u.  Depression: Patient notes this is somewhat better. At times she sits there and cries without any reason and she notes that has not been as bad since going on the Cymbalta. She does note the Cymbalta seems to make her nauseous though notes that is easing up some. She takes the Cymbalta at night and the nausea typically occurs the next morning. She does have anxiety and has been having some panic attacks particularly when she has to get in the car and go on the road. She rarely drives though notes panic attacks when she is the passenger as well.  Back pain/left leg pain: Patient continues to have issues with this though notes the pain is somewhat better with the Cymbalta. Is not tossing and turning as much at night. She continues on gabapentin.  Social History   Tobacco Use  Smoking Status Never Smoker  Smokeless Tobacco Never Used     ROS see history of present illness  Objective  Physical Exam Vitals:   05/29/19 1010  BP: 130/80  Pulse: (!) 103  Temp: (!) 97.1 F (36.2 C)  SpO2: 96%    BP Readings from Last 3 Encounters:  05/29/19 130/80  04/17/19 120/80  01/17/18 120/82   Wt Readings from Last 3 Encounters:  05/29/19 238 lb (108 kg)  04/17/19 237 lb (107.5 kg)  01/16/19 234 lb (106.1 kg)    Physical Exam Constitutional:      General: She is not in acute distress.    Appearance: She is not diaphoretic.  Cardiovascular:     Rate and Rhythm: Normal rate and regular rhythm.     Heart sounds: Normal heart sounds.  Pulmonary:     Effort: Pulmonary effort is normal.     Breath sounds: Normal breath sounds.  Skin:    General: Skin is warm and dry.  Neurological:     Mental Status: She is alert.     Comments: 5/5 strength bilateral quads, hamstrings, plantar flexion, and dorsiflexion      Assessment/Plan: Please see individual problem list.  Anxiety and  depression Depression has improved to a certain degree. Continues to have anxiety and some panic attacks. She has been having some nausea with the Cymbalta though this has been improving. I will check with our clinical pharmacist regarding the nausea with the Cymbalta to see if there are any recommendations there. We will start her on Atarax for as needed use for panic attacks. Discussed the risk of drowsiness with this. Advised not to take this when driving. If she gets excessively drowsy with this she will not continue on it.  Low back pain Cymbalta has been somewhat beneficial for this. She will continue Cymbalta and gabapentin.   No orders of the defined types were placed in this encounter.   Meds ordered this encounter  Medications  . hydrOXYzine (ATARAX/VISTARIL) 25 MG tablet    Sig: Take 1 tablet (25 mg total) by mouth 3 (three) times daily as needed.    Dispense:  30 tablet    Refill:  1    This visit occurred during the SARS-CoV-2 public health emergency.  Safety protocols were in place, including screening questions prior to the visit, additional usage of staff PPE, and extensive cleaning of exam room while observing appropriate contact time as indicated for disinfecting solutions.    Tommi Rumps, MD [place]

## 2019-05-29 NOTE — Assessment & Plan Note (Signed)
Depression has improved to a certain degree. Continues to have anxiety and some panic attacks. She has been having some nausea with the Cymbalta though this has been improving. I will check with our clinical pharmacist regarding the nausea with the Cymbalta to see if there are any recommendations there. We will start her on Atarax for as needed use for panic attacks. Discussed the risk of drowsiness with this. Advised not to take this when driving. If she gets excessively drowsy with this she will not continue on it.

## 2019-05-29 NOTE — Telephone Encounter (Signed)
Pt returned called at 4:40 5-26g

## 2019-05-29 NOTE — Patient Instructions (Signed)
Nice to see you. We will let you know what the pharmacist says. Please try the Atarax for your panic attacks. Please be careful when you take this as it can make you drowsy. Please do not drive when you are taking this.

## 2019-05-29 NOTE — Assessment & Plan Note (Signed)
Cymbalta has been somewhat beneficial for this. She will continue Cymbalta and gabapentin.

## 2019-05-29 NOTE — Telephone Encounter (Signed)
-----   Message from De Hollingshead, [facility] sent at 05/29/2019 11:33 AM EDT ----- Hmm. Maybe try with a small snack at night? I can't think of anything else besides planning on getting something on her stomach when she first wakes up (eg morning sickness-type counseling).   ----- Message ----- From: Leone Haven, MD Sent: 05/29/2019  10:25 AM EDT To: De Hollingshead, [facility]  Hey Catie,   This patient has been on cymbalta for depression and chronic pain for about 6 weeks. She noted pretty significant nausea to start with though it has been progressively improving. She still has some mild nausea now. She is taking it at night and the nausea occurs the next morning. Do you have any tricks to help with this? It has been helpful for her pain and depression, so we would prefer not to change it quite yet.  Randall Hiss

## 2019-05-29 NOTE — Telephone Encounter (Signed)
Please let the patient know that I heard back from the pharmacist.  The patient needs to try to eat a small snack at night before going to bed to see if this will prevent her nausea.  She should also try to get something on her stomach and right away when she first wakes up and see if that helps with her nausea.

## 2019-05-29 NOTE — Telephone Encounter (Signed)
LVM for the patient to call back for a message from the provider.  Miranda Evans,cma

## 2019-05-30 NOTE — Telephone Encounter (Signed)
I called and spoke with the patient and informed her that the message from the provider is to eat a small snack at night before going to bed and try to eat something when she first wakes up and see if this helps her and if it does not to give [place] a call back. Patient understood.   Joan Herschberger,cma

## 2019-05-30 NOTE — Telephone Encounter (Signed)
Called and spoke with the patient today.  Lamona Eimer,cma

## 2019-06-04 ENCOUNTER — Inpatient Hospital Stay
Admission: RE | Admit: 2019-06-04 | Discharge: 2019-06-04 | Disposition: A | Discharge status: Home / Self Care | Payer: Self-pay | Source: Ambulatory Visit | Attending: *Deleted | Admitting: *Deleted

## 2019-06-04 ENCOUNTER — Other Ambulatory Visit: Payer: Self-pay | Admitting: *Deleted

## 2019-06-04 ENCOUNTER — Telehealth: Payer: Self-pay | Admitting: Family Medicine

## 2019-06-04 DIAGNOSIS — Z1231 Encounter for screening mammogram for malignant neoplasm of breast: Secondary | ICD-10-CM

## 2019-06-04 NOTE — Telephone Encounter (Signed)
This patient had a mammogram completed recently though it has not been read. Can you contact [facility] and confirm that they are waiting on prior images for this patient before reading her current mammogram? Thanks.

## 2019-06-04 NOTE — Telephone Encounter (Signed)
I informed the provider that [facility] was waiting on results from [place].  Duward Allbritton,cma

## 2019-06-20 ENCOUNTER — Other Ambulatory Visit: Payer: Self-pay | Admitting: Family Medicine

## 2019-07-12 ENCOUNTER — Other Ambulatory Visit: Payer: Self-pay | Admitting: Family Medicine

## 2019-07-16 ENCOUNTER — Ambulatory Visit: Payer: Self-pay | Admitting: Family Medicine

## 2019-08-12 ENCOUNTER — Other Ambulatory Visit: Payer: Self-pay | Admitting: Family Medicine

## 2019-08-12 DIAGNOSIS — E785 Hyperlipidemia, unspecified: Secondary | ICD-10-CM

## 2019-08-23 ENCOUNTER — Ambulatory Visit: Payer: Self-pay | Admitting: Family Medicine

## 2019-08-26 ENCOUNTER — Encounter: Payer: Self-pay | Admitting: Family Medicine

## 2019-08-26 ENCOUNTER — Telehealth (INDEPENDENT_AMBULATORY_CARE_PROVIDER_SITE_OTHER): Payer: Self-pay | Admitting: Family Medicine

## 2019-08-26 ENCOUNTER — Other Ambulatory Visit: Payer: Self-pay

## 2019-08-26 DIAGNOSIS — F419 Anxiety disorder, unspecified: Secondary | ICD-10-CM

## 2019-08-26 DIAGNOSIS — F32A Depression, unspecified: Secondary | ICD-10-CM

## 2019-08-26 DIAGNOSIS — M545 Low back pain, unspecified: Secondary | ICD-10-CM

## 2019-08-26 DIAGNOSIS — I10 Essential (primary) hypertension: Secondary | ICD-10-CM

## 2019-08-26 DIAGNOSIS — F329 Major depressive disorder, single episode, unspecified: Secondary | ICD-10-CM

## 2019-08-26 DIAGNOSIS — G8929 Other chronic pain: Secondary | ICD-10-CM

## 2019-08-26 NOTE — Assessment & Plan Note (Signed)
Improving.  We will continue Cymbalta at its current dose.  If she has worsening symptoms she will let us know.

## 2019-08-26 NOTE — Progress Notes (Signed)
Virtual Visit via video Note  This visit type was conducted due to national recommendations for restrictions regarding the COVID-19 pandemic (e.g. social distancing).  This format is felt to be most appropriate for this patient at this time.  All issues noted in this document were discussed and addressed.  No physical exam was performed (except for noted visual exam findings with Video Visits).   I connected with Miranda Evans today at 11:30 AM EDT by a video enabled telemedicine application and verified that I am speaking with the correct person using two identifiers. Location patient: work Location provider: work Persons participating in the virtual visit: patient, provider  I discussed the limitations, risks, security and privacy concerns of performing an evaluation and management service by telephone and the availability of in person appointments. I also discussed with the patient that there may be a patient responsible charge related to this service. The patient expressed understanding and agreed to proceed.   Reason for visit: f/u.  HPI: HYPERTENSION  Disease Monitoring  Home BP Monitoring mostly <130/80 Chest pain- no    Dyspnea- no Medications  Compliance-  Taking amlodipine.  Edema- occasional recently after starting new job where she is on her feet more. Goes down over night. No orthopnea or PND.   Depression/anxiety: Patient notes both of these things are improving.  She is taking Cymbalta and notes that has helped her sleep better.  She does note some symptoms of depression and anxiety when she starts to think about not having insurance.  No SI.  Chronic back and leg pain: This is an ongoing issue.  She feels as though she is getting used to it to a certain degree.  She feels as though the Cymbalta and gabapentin have been helpful as she is able to sleep better at night.  Still occasionally gets numbness in her leg.  She notes the activity limitations for her work have been  helpful.     ROS: See pertinent positives and negatives per HPI.  Past Medical History:  Diagnosis Date  . Anxiety   . Depression   . Diarrhea 07/03/2015  . Hypertension   . Neuromuscular disorder ([place])   . Sleep apnea     Past Surgical History:  Procedure Laterality Date  . ABDOMINAL HYSTERECTOMY  2007   Partial (has ovaries)  . CHOLECYSTECTOMY  2014  . COLONOSCOPY WITH PROPOFOL N/A 01/15/2018   Procedure: COLONOSCOPY WITH PROPOFOL;  Surgeon: Lin Landsman, MD;  Location: [facility] ENDOSCOPY;  Service: Gastroenterology;  Laterality: N/A;    Family History  Problem Relation Age of Onset  . Hyperlipidemia Mother   . Hypertension Mother   . Diabetes Father   . Cancer Brother        Colon Cancer  . Cancer Maternal Aunt        Ovary/Uterus/Breast  . Hyperlipidemia Maternal Aunt   . Hypertension Maternal Aunt   . Diabetes Maternal Aunt   . Cancer Maternal Uncle        Colon Cancer  . Diabetes Maternal Uncle   . Cancer Paternal Aunt        Breast Cancer (2 Aunts)  . Diabetes Paternal Aunt   . Cancer Paternal Uncle        Colon Cancer  . Diabetes Paternal Uncle     SOCIAL HX: Non-smoker   Current Outpatient Medications:  .  amLODipine (NORVASC) 5 MG tablet, TAKE 1 TABLET(5 MG) BY MOUTH DAILY, Disp: 90 tablet, Rfl: 0 .  atorvastatin (LIPITOR)  40 MG tablet, TAKE 1 TABLET(40 MG) BY MOUTH DAILY, Disp: 90 tablet, Rfl: 3 .  DULoxetine (CYMBALTA) 30 MG capsule, Take 1 capsule (30 mg total) by mouth daily for 7 days, THEN 2 capsules (60 mg total) daily., Disp: 180 capsule, Rfl: 1 .  gabapentin (NEURONTIN) 100 MG capsule, TAKE 1 CAPSULE BY MOUTH THREE TIMES DAILY, Disp: 90 capsule, Rfl: 3 .  hydrOXYzine (ATARAX/VISTARIL) 25 MG tablet, Take 1 tablet (25 mg total) by mouth 3 (three) times daily as needed., Disp: 30 tablet, Rfl: 1 .  methocarbamol (ROBAXIN) 500 MG tablet, Take 1 tablet (500 mg total) by mouth every 6 (six) hours as needed for muscle spasms., Disp: 60 tablet,  Rfl: 2 .  Misc. Devices (ALL-BODY MASSAGE) MISC, 1 application by Does not apply route every 14 (fourteen) days., Disp: 1 each, Rfl: 2  EXAM:  VITALS per patient if applicable:  GENERAL: alert, oriented, appears well and in no acute distress  HEENT: atraumatic, conjunttiva clear, no obvious abnormalities on inspection of external nose and ears  NECK: normal movements of the head and neck  LUNGS: on inspection no signs of respiratory distress, breathing rate appears normal, no obvious gross SOB, gasping or wheezing  CV: no obvious cyanosis  MS: moves all visible extremities without noticeable abnormality  PSYCH/NEURO: pleasant and cooperative, no obvious depression or anxiety, speech and thought processing grossly intact  ASSESSMENT AND PLAN:  Discussed the following assessment and plan:  HTN (hypertension) Adequately controlled.  Continue amlodipine.  Discussed if it goes above 130/80 she should let us know.  Suspect the swelling is likely related to her amlodipine and we will monitor at this time.  Anxiety and depression Improving.  We will continue Cymbalta at its current dose.  If she has worsening symptoms she will let us know.  Low back pain Chronic issue.  Gabapentin and Cymbalta have been helpful.  We will continue with that regimen.   No orders of the defined types were placed in this encounter.   No orders of the defined types were placed in this encounter.    I discussed the assessment and treatment plan with the patient. The patient was provided an opportunity to ask questions and all were answered. The patient agreed with the plan and demonstrated an understanding of the instructions.   The patient was advised to call back or seek an in-person evaluation if the symptoms worsen or if the condition fails to improve as anticipated.   Tommi Rumps, MD

## 2019-08-26 NOTE — Assessment & Plan Note (Signed)
Adequately controlled.  Continue amlodipine.  Discussed if it goes above 130/80 she should let us know.  Suspect the swelling is likely related to her amlodipine and we will monitor at this time.

## 2019-08-26 NOTE — Assessment & Plan Note (Signed)
Chronic issue.  Gabapentin and Cymbalta have been helpful.  We will continue with that regimen.

## 2019-09-26 ENCOUNTER — Other Ambulatory Visit: Payer: Self-pay | Admitting: Family Medicine

## 2019-10-04 NOTE — Telephone Encounter (Signed)
Message relayed to patient.  Miranda Evans,cma

## 2019-10-09 ENCOUNTER — Encounter: Payer: Self-pay | Admitting: Family Medicine

## 2019-10-09 ENCOUNTER — Other Ambulatory Visit: Payer: Self-pay

## 2019-10-09 ENCOUNTER — Telehealth (INDEPENDENT_AMBULATORY_CARE_PROVIDER_SITE_OTHER): Payer: Self-pay | Admitting: Family Medicine

## 2019-10-09 ENCOUNTER — Telehealth: Payer: Self-pay

## 2019-10-09 DIAGNOSIS — F32A Depression, unspecified: Secondary | ICD-10-CM

## 2019-10-09 DIAGNOSIS — I10 Essential (primary) hypertension: Secondary | ICD-10-CM

## 2019-10-09 DIAGNOSIS — Z20822 Contact with and (suspected) exposure to covid-19: Secondary | ICD-10-CM

## 2019-10-09 DIAGNOSIS — F419 Anxiety disorder, unspecified: Secondary | ICD-10-CM

## 2019-10-09 MED ORDER — HYDROCHLOROTHIAZIDE 12.5 MG PO TABS: 12.5000 mg | ORAL_TABLET | Freq: Every day | ORAL | 1 refills | Status: DC

## 2019-10-09 MED ORDER — HYDROXYZINE HCL 25 MG PO TABS: 25.0000 mg | ORAL_TABLET | Freq: Three times a day (TID) | ORAL | 1 refills | Status: DC | PRN

## 2019-10-09 NOTE — Assessment & Plan Note (Signed)
Patient is fully vaccinated and asymptomatic.  She does not need to quarantine at home though I did discuss she needs to watch for symptoms for up to 14 days and get tested if she does develop symptoms.

## 2019-10-09 NOTE — Assessment & Plan Note (Signed)
Some mild symptoms recently.  I believe that finding a consistent job will help with her mental health significantly.  She will continue to look for a job.  She will continue on Cymbalta 60 mg once daily and hydroxyzine 25 mg 3 times daily as needed.  She will monitor for drowsiness with hydroxyzine.  She will not drive if she is drowsy.  She will avoid alcohol when taking hydroxyzine.

## 2019-10-09 NOTE — Assessment & Plan Note (Signed)
Adequately controlled though she is having swelling relating to her amlodipine.  We will start her on HCTZ 12.5 mg once daily.  She will return in 1 month for BP check with nursing and lab work.

## 2019-10-09 NOTE — Telephone Encounter (Signed)
lvm to set up 1 month non fasting labs and bp check. Needs 79m follow up with PCP as well

## 2019-10-09 NOTE — Progress Notes (Signed)
Virtual Visit via video Note  This visit type was conducted due to national recommendations for restrictions regarding the COVID-19 pandemic (e.g. social distancing).  This format is felt to be most appropriate for this patient at this time.  All issues noted in this document were discussed and addressed.  No physical exam was performed (except for noted visual exam findings with Video Visits).   I connected with Miranda Evans today at 10:30 AM EDT by a video enabled telemedicine application or telephone and verified that I am speaking with the correct person using two identifiers. Location patient: home Location provider: work  Persons participating in the virtual visit: patient, provider  I discussed the limitations, risks, security and privacy concerns of performing an evaluation and management service by telephone and the availability of in person appointments. I also discussed with the patient that there may be a patient responsible charge related to this service. The patient expressed understanding and agreed to proceed.   Reason for visit: f/u  HPI: HYPERTENSION  Disease Monitoring  Home BP Monitoring typically 120s/60 Chest pain- no    Dyspnea- no Medications  Compliance-  Taking amlodipine.   Edema- continues to have swelling  Depression/anxiety: Still having some issues with this.  Her temporary job ended so that has made things a little worse.  She feels as though the Cymbalta was quite beneficial.  Hydroxyzine is helpful as well though does make her somewhat drowsy.  She does not drive when she takes this.  She only takes it when she is at home.  No SI.  COVID-19 exposure: Patient notes she was exposed to her brother and sister-in-law who both tested positive for COVID-19 around 10/02/2019.  She has not developed any symptoms.  She is fully vaccinated.   ROS: See pertinent positives and negatives per HPI.  Past Medical History:  Diagnosis Date  . Anxiety   . Depression     . Diarrhea 07/03/2015  . Hypertension   . Neuromuscular disorder ([place])   . Sleep apnea     Past Surgical History:  Procedure Laterality Date  . ABDOMINAL HYSTERECTOMY  2007   Partial (has ovaries)  . CHOLECYSTECTOMY  2014  . COLONOSCOPY WITH PROPOFOL N/A 01/15/2018   Procedure: COLONOSCOPY WITH PROPOFOL;  Surgeon: Lin Landsman, MD;  Location: [facility] ENDOSCOPY;  Service: Gastroenterology;  Laterality: N/A;    Family History  Problem Relation Age of Onset  . Hyperlipidemia Mother   . Hypertension Mother   . Diabetes Father   . Cancer Brother        Colon Cancer  . Cancer Maternal Aunt        Ovary/Uterus/Breast  . Hyperlipidemia Maternal Aunt   . Hypertension Maternal Aunt   . Diabetes Maternal Aunt   . Cancer Maternal Uncle        Colon Cancer  . Diabetes Maternal Uncle   . Cancer Paternal Aunt        Breast Cancer (2 Aunts)  . Diabetes Paternal Aunt   . Cancer Paternal Uncle        Colon Cancer  . Diabetes Paternal Uncle     SOCIAL HX: Non-smoker   Current Outpatient Medications:  .  amLODipine (NORVASC) 5 MG tablet, TAKE 1 TABLET(5 MG) BY MOUTH DAILY, Disp: 90 tablet, Rfl: 0 .  atorvastatin (LIPITOR) 40 MG tablet, TAKE 1 TABLET(40 MG) BY MOUTH DAILY, Disp: 90 tablet, Rfl: 3 .  DULoxetine (CYMBALTA) 30 MG capsule, Take 1 capsule (30 mg total) by  mouth daily for 7 days, THEN 2 capsules (60 mg total) daily., Disp: 180 capsule, Rfl: 1 .  gabapentin (NEURONTIN) 100 MG capsule, TAKE 1 CAPSULE BY MOUTH THREE TIMES DAILY, Disp: 90 capsule, Rfl: 3 .  hydrOXYzine (ATARAX/VISTARIL) 25 MG tablet, Take 1 tablet (25 mg total) by mouth 3 (three) times daily as needed., Disp: 30 tablet, Rfl: 1 .  methocarbamol (ROBAXIN) 500 MG tablet, Take 1 tablet (500 mg total) by mouth every 6 (six) hours as needed for muscle spasms., Disp: 60 tablet, Rfl: 2 .  Misc. Devices (ALL-BODY MASSAGE) MISC, 1 application by Does not apply route every 14 (fourteen) days., Disp: 1 each, Rfl: 2 .   hydrochlorothiazide (HYDRODIURIL) 12.5 MG tablet, Take 1 tablet (12.5 mg total) by mouth daily., Disp: 90 tablet, Rfl: 1  EXAM:  VITALS per patient if applicable:  GENERAL: alert, oriented, appears well and in no acute distress  HEENT: atraumatic, conjunttiva clear, no obvious abnormalities on inspection of external nose and ears  NECK: normal movements of the head and neck  LUNGS: on inspection no signs of respiratory distress, breathing rate appears normal, no obvious gross SOB, gasping or wheezing  CV: no obvious cyanosis  MS: moves all visible extremities without noticeable abnormality  PSYCH/NEURO: pleasant and cooperative, no obvious depression or anxiety, speech and thought processing grossly intact  ASSESSMENT AND PLAN:  Discussed the following assessment and plan:  Problem List Items Addressed This Visit    Anxiety and depression    Some mild symptoms recently.  I believe that finding a consistent job will help with her mental health significantly.  She will continue to look for a job.  She will continue on Cymbalta 60 mg once daily and hydroxyzine 25 mg 3 times daily as needed.  She will monitor for drowsiness with hydroxyzine.  She will not drive if she is drowsy.  She will avoid alcohol when taking hydroxyzine.      Relevant Medications   hydrOXYzine (ATARAX/VISTARIL) 25 MG tablet   Close exposure to COVID-19 virus    Patient is fully vaccinated and asymptomatic.  She does not need to quarantine at home though I did discuss she needs to watch for symptoms for up to 14 days and get tested if she does develop symptoms.      HTN (hypertension)    Adequately controlled though she is having swelling relating to her amlodipine.  We will start her on HCTZ 12.5 mg once daily.  She will return in 1 month for BP check with nursing and lab work.      Relevant Medications   hydrochlorothiazide (HYDRODIURIL) 12.5 MG tablet   Other Relevant Orders   Basic Metabolic Panel  (BMET)       I discussed the assessment and treatment plan with the patient. The patient was provided an opportunity to ask questions and all were answered. The patient agreed with the plan and demonstrated an understanding of the instructions.   The patient was advised to call back or seek an in-person evaluation if the symptoms worsen or if the condition fails to improve as anticipated.    Tommi Rumps, MD

## 2019-11-02 ENCOUNTER — Other Ambulatory Visit: Payer: Self-pay | Admitting: Family Medicine

## 2019-12-25 ENCOUNTER — Other Ambulatory Visit: Payer: Self-pay | Admitting: Family Medicine

## 2020-02-24 ENCOUNTER — Other Ambulatory Visit: Payer: Self-pay | Admitting: Family Medicine

## 2020-03-24 ENCOUNTER — Other Ambulatory Visit: Payer: Self-pay | Admitting: Family Medicine

## 2020-06-22 ENCOUNTER — Other Ambulatory Visit: Payer: Self-pay | Admitting: Family Medicine

## 2020-06-30 ENCOUNTER — Other Ambulatory Visit: Payer: Self-pay | Admitting: Family Medicine

## 2020-08-01 ENCOUNTER — Other Ambulatory Visit: Payer: Self-pay | Admitting: Family Medicine

## 2020-08-17 ENCOUNTER — Other Ambulatory Visit: Payer: Self-pay | Admitting: Family Medicine

## 2020-08-17 DIAGNOSIS — E785 Hyperlipidemia, unspecified: Secondary | ICD-10-CM

## 2020-09-08 ENCOUNTER — Other Ambulatory Visit: Payer: Self-pay | Admitting: Family Medicine

## 2020-09-20 ENCOUNTER — Other Ambulatory Visit: Payer: Self-pay | Admitting: Family Medicine

## 2020-10-05 ENCOUNTER — Telehealth: Payer: Self-pay | Admitting: Family Medicine

## 2020-10-05 DIAGNOSIS — E785 Hyperlipidemia, unspecified: Secondary | ICD-10-CM

## 2020-10-05 DIAGNOSIS — R7309 Other abnormal glucose: Secondary | ICD-10-CM

## 2020-10-05 DIAGNOSIS — I10 Essential (primary) hypertension: Secondary | ICD-10-CM

## 2020-10-05 NOTE — Telephone Encounter (Signed)
It looks like this patient has not been seen in 1 year.  I got a reminder that she never had her lab work done after her visit last year.  She needs to be scheduled for follow-up if she is continuing to see [place] for her care.  We can get labs ordered for her to have those done as well if she is continuing with me as her PCP.  Please contact her regarding this.

## 2020-10-05 NOTE — Telephone Encounter (Signed)
Noted  Labs ordered

## 2020-10-05 NOTE — Addendum Note (Signed)
Addended by: Caryl Bis, Calan Doren G on: 10/05/2020 12:12 PM   Modules accepted: Orders

## 2020-10-31 ENCOUNTER — Other Ambulatory Visit: Payer: Self-pay | Admitting: Family Medicine

## 2020-11-20 ENCOUNTER — Other Ambulatory Visit: Payer: Self-pay

## 2020-11-20 ENCOUNTER — Other Ambulatory Visit (INDEPENDENT_AMBULATORY_CARE_PROVIDER_SITE_OTHER): Payer: BC Managed Care – PPO

## 2020-11-20 DIAGNOSIS — I10 Essential (primary) hypertension: Secondary | ICD-10-CM

## 2020-11-20 DIAGNOSIS — E785 Hyperlipidemia, unspecified: Secondary | ICD-10-CM

## 2020-11-20 DIAGNOSIS — R7309 Other abnormal glucose: Secondary | ICD-10-CM

## 2020-11-20 LAB — COMPREHENSIVE METABOLIC PANEL
ALT: 19 U/L (ref 0–35)
AST: 14 U/L (ref 0–37)
Albumin: 4.3 g/dL (ref 3.5–5.2)
Alkaline Phosphatase: 91 U/L (ref 39–117)
BUN: 17 mg/dL (ref 6–23)
CO2: 28 mEq/L (ref 19–32)
Calcium: 9.5 mg/dL (ref 8.4–10.5)
Chloride: 105 mEq/L (ref 96–112)
Creatinine, Ser: 0.75 mg/dL (ref 0.40–1.20)
GFR: 87.71 mL/min (ref >60.00)
Glucose, Bld: 103 mg/dL — ABNORMAL HIGH (ref 70–99)
Potassium: 3.9 mEq/L (ref 3.5–5.1)
Sodium: 141 mEq/L (ref 135–145)
Total Bilirubin: 0.7 mg/dL (ref 0.2–1.2)
Total Protein: 8.4 g/dL — ABNORMAL HIGH (ref 6.0–8.3)

## 2020-11-20 LAB — LIPID PANEL
Cholesterol: 172 mg/dL (ref 0–200)
HDL: 43.8 mg/dL (ref >39.00)
LDL Cholesterol: 112 mg/dL — ABNORMAL HIGH (ref 0–99)
NonHDL: 127.7
Total CHOL/HDL Ratio: 4
Triglycerides: 79 mg/dL (ref 0.0–149.0)
VLDL: 15.8 mg/dL (ref 0.0–40.0)

## 2020-11-20 LAB — HEMOGLOBIN A1C: Hgb A1c MFr Bld: 6.6 % — ABNORMAL HIGH (ref 4.6–6.5)

## 2020-11-25 ENCOUNTER — Other Ambulatory Visit: Payer: Self-pay | Admitting: Family Medicine

## 2020-11-25 ENCOUNTER — Encounter: Payer: Self-pay | Admitting: Family Medicine

## 2020-11-25 ENCOUNTER — Other Ambulatory Visit: Payer: Self-pay

## 2020-11-25 ENCOUNTER — Telehealth (INDEPENDENT_AMBULATORY_CARE_PROVIDER_SITE_OTHER): Payer: BC Managed Care – PPO | Admitting: Family Medicine

## 2020-11-25 VITALS — Ht 67.0 in | Wt 232.0 lb

## 2020-11-25 DIAGNOSIS — F32A Depression, unspecified: Secondary | ICD-10-CM

## 2020-11-25 DIAGNOSIS — E119 Type 2 diabetes mellitus without complications: Secondary | ICD-10-CM

## 2020-11-25 DIAGNOSIS — E785 Hyperlipidemia, unspecified: Secondary | ICD-10-CM

## 2020-11-25 DIAGNOSIS — J069 Acute upper respiratory infection, unspecified: Secondary | ICD-10-CM

## 2020-11-25 DIAGNOSIS — F419 Anxiety disorder, unspecified: Secondary | ICD-10-CM

## 2020-11-25 DIAGNOSIS — I10 Essential (primary) hypertension: Secondary | ICD-10-CM

## 2020-11-25 MED ORDER — HYDROXYZINE HCL 25 MG PO TABS: 25.0000 mg | ORAL_TABLET | Freq: Three times a day (TID) | ORAL | 1 refills | Status: DC | PRN

## 2020-11-25 MED ORDER — ATORVASTATIN CALCIUM 80 MG PO TABS: 80.0000 mg | ORAL_TABLET | Freq: Every day | ORAL | 1 refills | Status: DC

## 2020-11-25 MED ORDER — AMLODIPINE BESYLATE 5 MG PO TABS: ORAL_TABLET | ORAL | 1 refills | Status: DC

## 2020-11-25 MED ORDER — HYDROCHLOROTHIAZIDE 12.5 MG PO TABS: 12.5000 mg | ORAL_TABLET | Freq: Every day | ORAL | 1 refills | Status: DC

## 2020-11-25 NOTE — Assessment & Plan Note (Signed)
I suspect this is an upper respiratory infection related to a virus.  Discussed supportive care with continued over-the-counter NyQuil cold and flu and TheraFlu.  Discussed she could try Flonase and Zyrtec as well.  Discussed if she starts to feel worse or is not feeling better over the next couple of days she could retest herself for COVID.  She notes she is not seeing anybody for Thanksgiving tomorrow.

## 2020-11-25 NOTE — Assessment & Plan Note (Signed)
Generally well controlled.  Only occasionally has anxiety.  Discussed that we could continue with hydroxyzine on an as-needed basis.  If things worsen we could always retry the Cymbalta.

## 2020-11-25 NOTE — Assessment & Plan Note (Signed)
Not at goal given her diabetes diagnosis.  We will increase her Lipitor to 80 mg once daily and check lab work in 1 month.

## 2020-11-25 NOTE — Progress Notes (Signed)
Virtual Visit via video Note  This visit type was conducted due to national recommendations for restrictions regarding the COVID-19 pandemic (e.g. social distancing).  This format is felt to be most appropriate for this patient at this time.  All issues noted in this document were discussed and addressed.  No physical exam was performed (except for noted visual exam findings with Video Visits).   I connected with Miranda Evans today at  1:15 PM EST by a video enabled telemedicine application and verified that I am speaking with the correct person using two identifiers. Location patient: home Location provider: work  Persons participating in the virtual visit: patient, provider  I discussed the limitations, risks, security and privacy concerns of performing an evaluation and management service by telephone and the availability of in person appointments. I also discussed with the patient that there may be a patient responsible charge related to this service. The patient expressed understanding and agreed to proceed.  Reason for visit: Follow-up.  HPI: HYPERTENSION Disease Monitoring Home BP Monitoring 130s-140s/80s-90s Chest pain- no    Dyspnea- no Medications Compliance-  taking amlodipine  Edema- no BMET    Component Value Date/Time   NA 141 11/20/2020 1058   NA 142 01/13/2012 1440   K 3.9 11/20/2020 1058   K 3.6 01/13/2012 1440   CL 105 11/20/2020 1058   CL 106 01/13/2012 1440   CO2 28 11/20/2020 1058   CO2 32 01/13/2012 1440   GLUCOSE 103 (H) 11/20/2020 1058   GLUCOSE 89 01/13/2012 1440   BUN 17 11/20/2020 1058   BUN 7 01/13/2012 1440   CREATININE 0.75 11/20/2020 1058   CREATININE 0.90 01/13/2012 1440   CALCIUM 9.5 11/20/2020 1058   CALCIUM 8.4 (L) 01/13/2012 1440   GFRNONAA >60 01/13/2012 1440   GFRAA >60 01/13/2012 1440   Hyperlipidemia: She has been taking Lipitor daily.  Recent LDL above 100.  Diabetes: Recent A1c of 6.6.  She is not exercising much.  She did not  want to talk about her diet as it has not been good though she does drink a lot of sugary drinks.  Anxiety/depression: Patient notes generally these things are well controlled.  She does not leave the house much so that helps with the anxiety.  She notes mild depression related to a lot of sickness and death in her family this year.  She is no longer on Cymbalta.  She needs a refill on the hydroxyzine.  No SI.  Upper respiratory infection: Patient notes this started on 11/22/2020.  She has congestion, rhinorrhea, and mild cough.  She had a negative COVID test on 11/24/2020.  This was a home test.  She has no fevers.  No taste or smell disturbances.  She has been using over-the-counter NyQuil cold and flu as well as TheraFlu with some benefit.  She was around her parents who have been sick with similar symptoms.   ROS: See pertinent positives and negatives per HPI.  Past Medical History:  Diagnosis Date   Anxiety    Depression    Diarrhea 07/03/2015   Hypertension    Neuromuscular disorder ([place])    Sleep apnea     Past Surgical History:  Procedure Laterality Date   ABDOMINAL HYSTERECTOMY  2007   Partial (has ovaries)   CHOLECYSTECTOMY  2014   COLONOSCOPY WITH PROPOFOL N/A 01/15/2018   Procedure: COLONOSCOPY WITH PROPOFOL;  Surgeon: Lin Landsman, MD;  Location: [facility];  Service: Gastroenterology;  Laterality: N/A;  Family History  Problem Relation Age of Onset   Hyperlipidemia Mother    Hypertension Mother    Diabetes Father    Cancer Brother        Colon Cancer   Cancer Maternal Aunt        Ovary/Uterus/Breast   Hyperlipidemia Maternal Aunt    Hypertension Maternal Aunt    Diabetes Maternal Aunt    Cancer Maternal Uncle        Colon Cancer   Diabetes Maternal Uncle    Cancer Paternal Aunt        Breast Cancer (2 Aunts)   Diabetes Paternal Aunt    Cancer Paternal Uncle        Colon Cancer   Diabetes Paternal Uncle     SOCIAL HX: Non-smoker   Current  Outpatient Medications:    gabapentin (NEURONTIN) 100 MG capsule, TAKE 1 CAPSULE BY MOUTH THREE TIMES DAILY, Disp: 90 capsule, Rfl: 0   amLODipine (NORVASC) 5 MG tablet, TAKE 1 TABLET(5 MG) BY MOUTH DAILY, Disp: 90 tablet, Rfl: 1   atorvastatin (LIPITOR) 80 MG tablet, Take 1 tablet (80 mg total) by mouth daily., Disp: 90 tablet, Rfl: 1   hydrochlorothiazide (HYDRODIURIL) 12.5 MG tablet, Take 1 tablet (12.5 mg total) by mouth daily., Disp: 90 tablet, Rfl: 1   hydrOXYzine (ATARAX/VISTARIL) 25 MG tablet, Take 1 tablet (25 mg total) by mouth 3 (three) times daily as needed., Disp: 30 tablet, Rfl: 1  EXAM:  VITALS per patient if applicable:  GENERAL: alert, oriented, appears well and in no acute distress  HEENT: atraumatic, conjunttiva clear, no obvious abnormalities on inspection of external nose and ears  NECK: normal movements of the head and neck  LUNGS: on inspection no signs of respiratory distress, breathing rate appears normal, no obvious gross SOB, gasping or wheezing  CV: no obvious cyanosis  MS: moves all visible extremities without noticeable abnormality  PSYCH/NEURO: pleasant and cooperative, no obvious depression or anxiety, speech and thought processing grossly intact  ASSESSMENT AND PLAN:  Discussed the following assessment and plan:  Problem List Items Addressed This Visit     Anxiety and depression    Generally well controlled.  Only occasionally has anxiety.  Discussed that we could continue with hydroxyzine on an as-needed basis.  If things worsen we could always retry the Cymbalta.      Relevant Medications   hydrOXYzine (ATARAX/VISTARIL) 25 MG tablet   Controlled type 2 diabetes mellitus without complication, without long-term current use of insulin (HCC)    A1c of 6.6.  Discussed option of starting metformin and working on diet and exercise or just working on diet and exercise.  She opted to increase her activity level and cut down on her sugary drinks and  sweets intake.      Relevant Medications   atorvastatin (LIPITOR) 80 MG tablet   Other Relevant Orders   Urine Microalbumin w/creat. ratio   HTN (hypertension) - Primary    Above goal.  We will continue amlodipine 5 mg once daily and restart her hydrochlorothiazide 12.5 mg daily.  She will come back in for labs in 1 month and a nurse blood pressure check at that time.  Follow-up with me in 3 months.      Relevant Medications   amLODipine (NORVASC) 5 MG tablet   hydrochlorothiazide (HYDRODIURIL) 12.5 MG tablet   atorvastatin (LIPITOR) 80 MG tablet   Hyperlipidemia    Not at goal given her diabetes diagnosis.  We will increase her Lipitor  to 80 mg once daily and check lab work in 1 month.      Relevant Medications   amLODipine (NORVASC) 5 MG tablet   hydrochlorothiazide (HYDRODIURIL) 12.5 MG tablet   atorvastatin (LIPITOR) 80 MG tablet   Other Relevant Orders   Comp Met (CMET)   LDL cholesterol, direct   Upper respiratory infection    I suspect this is an upper respiratory infection related to a virus.  Discussed supportive care with continued over-the-counter NyQuil cold and flu and TheraFlu.  Discussed she could try Flonase and Zyrtec as well.  Discussed if she starts to feel worse or is not feeling better over the next couple of days she could retest herself for COVID.  She notes she is not seeing anybody for Thanksgiving tomorrow.       Return in about 1 month (around 12/25/2020) for Labs and nurse blood pressure check, 3 months PCP.   I discussed the assessment and treatment plan with the patient. The patient was provided an opportunity to ask questions and all were answered. The patient agreed with the plan and demonstrated an understanding of the instructions.   The patient was advised to call back or seek an in-person evaluation if the symptoms worsen or if the condition fails to improve as anticipated.   Tommi Rumps, MD

## 2020-11-25 NOTE — Assessment & Plan Note (Signed)
A1c of 6.6.  Discussed option of starting metformin and working on diet and exercise or just working on diet and exercise.  She opted to increase her activity level and cut down on her sugary drinks and sweets intake.

## 2020-11-25 NOTE — Assessment & Plan Note (Signed)
Above goal.  We will continue amlodipine 5 mg once daily and restart her hydrochlorothiazide 12.5 mg daily.  She will come back in for labs in 1 month and a nurse blood pressure check at that time.  Follow-up with me in 3 months.

## 2020-11-30 ENCOUNTER — Telehealth: Payer: Self-pay | Admitting: Family Medicine

## 2020-11-30 DIAGNOSIS — E785 Hyperlipidemia, unspecified: Secondary | ICD-10-CM

## 2020-11-30 NOTE — Telephone Encounter (Signed)
[facility] called and needs to know if prescription should be atorvastatin (LIPITOR) 80 MG tablet OR 40MG . They received 2 prescriptions.

## 2020-12-01 MED ORDER — ATORVASTATIN CALCIUM 80 MG PO TABS: 80.0000 mg | ORAL_TABLET | Freq: Every day | ORAL | 1 refills | Status: DC

## 2020-12-01 NOTE — Addendum Note (Signed)
Addended by: Elpidio Galea T on: 12/01/2020 10:19 AM   Modules accepted: Orders

## 2021-06-09 ENCOUNTER — Other Ambulatory Visit: Payer: Self-pay | Admitting: Family Medicine

## 2021-06-09 DIAGNOSIS — I10 Essential (primary) hypertension: Secondary | ICD-10-CM

## 2021-08-23 ENCOUNTER — Other Ambulatory Visit: Payer: Self-pay | Admitting: Family Medicine

## 2021-08-23 DIAGNOSIS — I10 Essential (primary) hypertension: Secondary | ICD-10-CM

## 2021-11-22 ENCOUNTER — Telehealth: Payer: Self-pay | Admitting: Family Medicine

## 2021-11-22 NOTE — Telephone Encounter (Signed)
I received a reminder from epic that the patient did not have the labs that were ordered last year.  Please contact her and see if she is still seeing me as her PCP.  If she is she needs to be scheduled for follow-up.

## 2021-11-22 NOTE — Telephone Encounter (Signed)
LVM for patient to call back.   Tondra Reierson,cma  

## 2022-08-09 ENCOUNTER — Ambulatory Visit: Payer: BC Managed Care – PPO | Admitting: Family Medicine

## 2022-08-09 ENCOUNTER — Encounter: Payer: Self-pay | Admitting: Family Medicine

## 2022-08-09 VITALS — BP 120/82 | HR 97 | Temp 98.5°F | Ht 67.0 in | Wt 241.2 lb

## 2022-08-09 DIAGNOSIS — L819 Disorder of pigmentation, unspecified: Secondary | ICD-10-CM

## 2022-08-09 DIAGNOSIS — M545 Low back pain, unspecified: Secondary | ICD-10-CM

## 2022-08-09 DIAGNOSIS — G8929 Other chronic pain: Secondary | ICD-10-CM

## 2022-08-09 DIAGNOSIS — F32A Depression, unspecified: Secondary | ICD-10-CM

## 2022-08-09 DIAGNOSIS — F419 Anxiety disorder, unspecified: Secondary | ICD-10-CM

## 2022-08-09 DIAGNOSIS — I10 Essential (primary) hypertension: Secondary | ICD-10-CM | POA: Diagnosis not present

## 2022-08-09 DIAGNOSIS — E119 Type 2 diabetes mellitus without complications: Secondary | ICD-10-CM | POA: Diagnosis not present

## 2022-08-09 DIAGNOSIS — E785 Hyperlipidemia, unspecified: Secondary | ICD-10-CM | POA: Diagnosis not present

## 2022-08-09 DIAGNOSIS — R0989 Other specified symptoms and signs involving the circulatory and respiratory systems: Secondary | ICD-10-CM

## 2022-08-09 LAB — MICROALBUMIN / CREATININE URINE RATIO
Creatinine,U: 159.1 mg/dL
Microalb Creat Ratio: 0.4 mg/g (ref 0.0–30.0)
Microalb, Ur: <0.7 mg/dL (ref 0.0–1.9)

## 2022-08-09 LAB — COMPREHENSIVE METABOLIC PANEL
ALT: 15 U/L (ref 0–35)
AST: 13 U/L (ref 0–37)
Albumin: 4.2 g/dL (ref 3.5–5.2)
Alkaline Phosphatase: 71 U/L (ref 39–117)
BUN: 16 mg/dL (ref 6–23)
CO2: 28 mEq/L (ref 19–32)
Calcium: 9.6 mg/dL (ref 8.4–10.5)
Chloride: 103 mEq/L (ref 96–112)
Creatinine, Ser: 0.82 mg/dL (ref 0.40–1.20)
GFR: 77.86 mL/min (ref >60.00)
Glucose, Bld: 100 mg/dL — ABNORMAL HIGH (ref 70–99)
Potassium: 4.1 mEq/L (ref 3.5–5.1)
Sodium: 141 mEq/L (ref 135–145)
Total Bilirubin: 0.5 mg/dL (ref 0.2–1.2)
Total Protein: 8.2 g/dL (ref 6.0–8.3)

## 2022-08-09 LAB — LIPID PANEL
Cholesterol: 256 mg/dL — ABNORMAL HIGH (ref 0–200)
HDL: 51.2 mg/dL (ref >39.00)
LDL Cholesterol: 181 mg/dL — ABNORMAL HIGH (ref 0–99)
NonHDL: 204.45
Total CHOL/HDL Ratio: 5
Triglycerides: 118 mg/dL (ref 0.0–149.0)
VLDL: 23.6 mg/dL (ref 0.0–40.0)

## 2022-08-09 LAB — HEMOGLOBIN A1C: Hgb A1c MFr Bld: 6.5 % (ref 4.6–6.5)

## 2022-08-09 MED ORDER — AMLODIPINE BESYLATE 5 MG PO TABS: ORAL_TABLET | ORAL | 1 refills | Status: DC

## 2022-08-09 MED ORDER — HYDROXYZINE HCL 25 MG PO TABS: 25.0000 mg | ORAL_TABLET | Freq: Three times a day (TID) | ORAL | 1 refills | Status: AC | PRN

## 2022-08-09 MED ORDER — ATORVASTATIN CALCIUM 80 MG PO TABS: 80.0000 mg | ORAL_TABLET | Freq: Every day | ORAL | 1 refills | Status: DC

## 2022-08-09 MED ORDER — HYDROCHLOROTHIAZIDE 12.5 MG PO TABS: ORAL_TABLET | ORAL | 1 refills | Status: DC

## 2022-08-09 MED ORDER — PREGABALIN 50 MG PO CAPS: 50.0000 mg | ORAL_CAPSULE | Freq: Two times a day (BID) | ORAL | 3 refills | Status: AC

## 2022-08-09 NOTE — Assessment & Plan Note (Addendum)
Chronic back pain.  Recent flare that is improving.  We will trial switching over to Lyrica 50 mg twice daily.  She will discontinue gabapentin and start the Lyrica the next day.  Advised not to take both of these together.  She will monitor for drowsiness and any other side effects that occur.  Advised if Lyrica is not working over the next week she will let us know and we can increase the dose.

## 2022-08-09 NOTE — Patient Instructions (Signed)
Nice to see. I sent in Lyrica for you to try for your back pain and the symptoms into your leg on the left.  If this is not helpful please let me know in about a week.  If you have any side effects or drowsiness with the Lyrica please let us know.  Do not take any gabapentin while taking the Lyrica.

## 2022-08-09 NOTE — Assessment & Plan Note (Signed)
Noted on foot.  Patient reports this is a chronic lesion that is from many years ago from an injury.  She will monitor.

## 2022-08-09 NOTE — Assessment & Plan Note (Signed)
Chronic issue.  Adequately controlled at home.  Patient will continue amlodipine 5 mg daily and HCTZ 12.5 mg daily.  Discussed a goal blood pressure of less than 130/80.

## 2022-08-09 NOTE — Progress Notes (Signed)
Marikay Alar, MD Phone: [phone]  Miranda Evans is a 62 y.o. female who presents today for f/u.  HYPERTENSION Disease Monitoring Home BP Monitoring <130/80 Chest pain- no    Dyspnea- no Medications Compliance-  taking amlodipine, HCTZ.   Edema- no BMET    Component Value Date/Time   NA 141 11/20/2020 1058   NA 142 01/13/2012 1440   K 3.9 11/20/2020 1058   K 3.6 01/13/2012 1440   CL 105 11/20/2020 1058   CL 106 01/13/2012 1440   CO2 28 11/20/2020 1058   CO2 32 01/13/2012 1440   GLUCOSE 103 (H) 11/20/2020 1058   GLUCOSE 89 01/13/2012 1440   BUN 17 11/20/2020 1058   BUN 7 01/13/2012 1440   CREATININE 0.75 11/20/2020 1058   CREATININE 0.90 01/13/2012 1440   CALCIUM 9.5 11/20/2020 1058   CALCIUM 8.4 (L) 01/13/2012 1440   GFRNONAA >60 01/13/2012 1440   GFRAA >60 01/13/2012 1440   HYPERLIPIDEMIA Symptoms Chest pain on exertion:  no   Medications: Compliance- taking crestor Right upper quadrant pain- yes, occasionally with eating and with bending over, notes she had a cholescystectomy  Muscle aches- no Lipid Panel     Component Value Date/Time   CHOL 172 11/20/2020 1058   TRIG 79.0 11/20/2020 1058   HDL 43.80 11/20/2020 1058   CHOLHDL 4 11/20/2020 1058   VLDL 15.8 11/20/2020 1058   LDLCALC 112 (H) 11/20/2020 1058   LDLDIRECT 100.0 04/16/2019 0856   Diabetes: Patient notes her diet is not good.  She is been trying to walk and doing some weights for exercise.  She saw ophthalmology in March.  Chronic back pain: Patient notes this had a flare recently as she fell helping her mother.  She notes her left knee gave out on her and that typically does not occur.  She notes still having some intermittent left leg numbness.  She has been using a cane though her back is improving so she is going to wean off of that.  She has tried injections and PT in the past with little benefit.  She takes gabapentin 1 capsule during the day given that if she takes more than that she gets  drowsy.  Does provide some benefit.  Has not taken any other medications for her back previously.  Social History   Tobacco Use  Smoking Status Never  Smokeless Tobacco Never    No current outpatient medications on file prior to visit.   No current facility-administered medications on file prior to visit.     ROS see history of present illness  Objective  Physical Exam Vitals:   08/09/22 1014 08/09/22 1034  BP: 128/84 120/82  Pulse: 97   Temp: 98.5 F (36.9 C)   SpO2: 97%     BP Readings from Last 3 Encounters:  08/09/22 120/82  05/29/19 130/80  04/17/19 120/80   Wt Readings from Last 3 Encounters:  08/09/22 241 lb 3.2 oz (109.4 kg)  11/25/20 232 lb (105.2 kg)  10/09/19 238 lb (108 kg)    Physical Exam Constitutional:      General: She is not in acute distress.    Appearance: She is not diaphoretic.  Cardiovascular:     Rate and Rhythm: Normal rate and regular rhythm.     Heart sounds: Normal heart sounds.  Pulmonary:     Effort: Pulmonary effort is normal.     Breath sounds: Normal breath sounds.  Abdominal:     General: Bowel sounds are normal. There  is no distension.     Palpations: Abdomen is soft.     Tenderness: There is no abdominal tenderness.  Musculoskeletal:     Right lower leg: No edema.     Left lower leg: No edema.       Feet:  Skin:    General: Skin is warm and dry.  Neurological:     Mental Status: She is alert.      Assessment/Plan: Please see individual problem list.  Controlled type 2 diabetes mellitus without complication, without long-term current use of insulin (HCC) Assessment & Plan: Chronic issue.  Check A1c.  Encouraged healthier diet.  Encouraged to continue with exercise.  Orders: -     Hemoglobin A1c -     Microalbumin / creatinine urine ratio  Hypertension, unspecified type Assessment & Plan: Chronic issue.  Adequately controlled at home.  Patient will continue amlodipine 5 mg daily and HCTZ 12.5 mg daily.   Discussed a goal blood pressure of less than 130/80.  Orders: -     amLODIPine Besylate; TAKE 1 TABLET(5 MG) BY MOUTH DAILY  Dispense: 90 tablet; Refill: 1 -     hydroCHLOROthiazide; TAKE 1 TABLET(12.5 MG) BY MOUTH DAILY  Dispense: 90 tablet; Refill: 1  Hyperlipidemia, unspecified hyperlipidemia type -     Atorvastatin Calcium; Take 1 tablet (80 mg total) by mouth daily.  Dispense: 90 tablet; Refill: 1 -     Comprehensive metabolic panel -     Lipid panel  Anxiety and depression -     hydrOXYzine HCl; Take 1 tablet (25 mg total) by mouth 3 (three) times daily as needed.  Dispense: 30 tablet; Refill: 1  Chronic left-sided low back pain without sciatica Assessment & Plan: Chronic back pain.  Recent flare that is improving.  We will trial switching over to Lyrica 50 mg twice daily.  She will discontinue gabapentin and start the Lyrica the next day.  Advised not to take both of these together.  She will monitor for drowsiness and any other side effects that occur.  Advised if Lyrica is not working over the next week she will let us know and we can increase the dose.  Orders: -     Pregabalin; Take 1 capsule (50 mg total) by mouth 2 (two) times daily.  Dispense: 60 capsule; Refill: 3  Hyperpigmented skin lesion Assessment & Plan: Noted on foot.  Patient reports this is a chronic lesion that is from many years ago from an injury.  She will monitor.   Decreased pedal pulses Assessment & Plan: Difficulty palpating pedal pulses today.  ABIs ordered.  Orders: -     US ARTERIAL ABI (SCREENING LOWER EXTREMITY); Future     Return in about 6 months (around 02/09/2023) for Hypertension/diabetes/chronic back pain.   Marikay Alar, MD [facility]

## 2022-08-09 NOTE — Assessment & Plan Note (Signed)
Difficulty palpating pedal pulses today.  ABIs ordered.

## 2022-08-09 NOTE — Assessment & Plan Note (Signed)
Chronic issue.  Check A1c.  Encouraged healthier diet.  Encouraged to continue with exercise.

## 2022-08-15 ENCOUNTER — Other Ambulatory Visit: Payer: Self-pay

## 2022-08-15 ENCOUNTER — Telehealth: Payer: Self-pay

## 2022-08-15 DIAGNOSIS — E785 Hyperlipidemia, unspecified: Secondary | ICD-10-CM

## 2022-08-15 DIAGNOSIS — I10 Essential (primary) hypertension: Secondary | ICD-10-CM

## 2022-08-15 NOTE — Telephone Encounter (Signed)
LMTCB. Need to scheduled pt a lab appt 6 weeks after she started back on her statin/cholesterol medication.

## 2022-08-19 ENCOUNTER — Ambulatory Visit
Admission: RE | Admit: 2022-08-19 | Discharge: 2022-08-19 | Disposition: A | Discharge status: Home / Self Care | Payer: BC Managed Care – PPO | Source: Ambulatory Visit | Attending: Family Medicine | Admitting: Family Medicine

## 2022-08-19 DIAGNOSIS — R0989 Other specified symptoms and signs involving the circulatory and respiratory systems: Secondary | ICD-10-CM | POA: Diagnosis not present

## 2022-08-23 ENCOUNTER — Other Ambulatory Visit: Payer: Self-pay | Admitting: Family Medicine

## 2022-08-23 DIAGNOSIS — R6889 Other general symptoms and signs: Secondary | ICD-10-CM

## 2022-09-30 ENCOUNTER — Other Ambulatory Visit (INDEPENDENT_AMBULATORY_CARE_PROVIDER_SITE_OTHER): Payer: BC Managed Care – PPO

## 2022-09-30 DIAGNOSIS — E785 Hyperlipidemia, unspecified: Secondary | ICD-10-CM

## 2022-10-04 LAB — HEPATIC FUNCTION PANEL
ALT: 17 U/L (ref 0–35)
AST: 12 U/L (ref 0–37)
Albumin: 4.1 g/dL (ref 3.5–5.2)
Alkaline Phosphatase: 82 U/L (ref 39–117)
Bilirubin, Direct: 0.1 mg/dL (ref 0.0–0.3)
Total Bilirubin: 0.9 mg/dL (ref 0.2–1.2)
Total Protein: 8.4 g/dL — ABNORMAL HIGH (ref 6.0–8.3)

## 2022-10-04 LAB — LDL CHOLESTEROL, DIRECT: Direct LDL: 110 mg/dL

## 2022-10-07 ENCOUNTER — Ambulatory Visit (INDEPENDENT_AMBULATORY_CARE_PROVIDER_SITE_OTHER): Payer: BC Managed Care – PPO | Admitting: Nurse Practitioner

## 2022-10-07 VITALS — BP 122/70 | HR 99 | Resp 18 | Ht 67.0 in | Wt 242.8 lb

## 2022-10-07 DIAGNOSIS — I10 Essential (primary) hypertension: Secondary | ICD-10-CM | POA: Diagnosis not present

## 2022-10-07 DIAGNOSIS — E119 Type 2 diabetes mellitus without complications: Secondary | ICD-10-CM | POA: Diagnosis not present

## 2022-10-07 DIAGNOSIS — R6889 Other general symptoms and signs: Secondary | ICD-10-CM | POA: Diagnosis not present

## 2022-10-11 ENCOUNTER — Encounter (INDEPENDENT_AMBULATORY_CARE_PROVIDER_SITE_OTHER): Payer: Self-pay | Admitting: Nurse Practitioner

## 2022-10-11 NOTE — Progress Notes (Signed)
Subjective:    Patient ID: Miranda Evans, female    DOB: 1960/04/23, 62 y.o.   MRN: [id number] Chief Complaint  Patient presents with   Establish Care    Miranda Evans is a 62 year old female referred by Dr. Birdie Sons in regards to abnormal ABIs.  The patient had an ABI of 1.02 on the right and 0.93 on the left.  She was noted to have normal waveforms at the ankle bilaterally.  She is referred due to the fact that she has been having ongoing pain in her left lower extremity.  This has been ongoing for some time and she has had shots as well as physical therapy.  These have shown some variable improvements.  She denies classic claudication-like symptoms but does have left leg numbness that comes and goes.  She denies classic rest pain and there are currently no open wounds or ulcerations.    Review of Systems  Musculoskeletal:  Positive for arthralgias and gait problem.  All other systems reviewed and are negative.      Objective:   Physical Exam Vitals reviewed.  HENT:     Head: Normocephalic.  Cardiovascular:     Rate and Rhythm: Normal rate.     Pulses:          Dorsalis pedis pulses are 2+ on the right side and 2+ on the left side.  Pulmonary:     Effort: Pulmonary effort is normal.  Skin:    General: Skin is warm and dry.  Neurological:     Mental Status: She is alert and oriented to person, place, and time.  Psychiatric:        Mood and Affect: Mood normal.        Behavior: Behavior normal.        Thought Content: Thought content normal.        Judgment: Judgment normal.     BP 122/70 (BP Location: Left Arm)   Pulse 99   Resp 18   Ht 5\' 7"  (1.702 m)   Wt 242 lb 12.8 oz (110.1 kg)   LMP  (LMP Unknown)   BMI 38.03 kg/m   Past Medical History:  Diagnosis Date   Anxiety    Depression    Diarrhea 07/03/2015   Hypertension    Neuromuscular disorder (HCC)    Sleep apnea     Social History   Socioeconomic History   Marital status: Married    Spouse name:  Not on file   Number of children: Not on file   Years of education: Not on file   Highest education level: Associate degree: academic program  Occupational History   Not on file  Tobacco Use   Smoking status: Never   Smokeless tobacco: Never  Vaping Use   Vaping status: Never Used  Substance and Sexual Activity   Alcohol use: Yes    Alcohol/week: 0.0 standard drinks of alcohol    Comment: Rare    Drug use: No   Sexual activity: Yes    Partners: Male    Birth control/protection: Surgical    Comment: Husband  Other Topics Concern   Not on file  Social History Narrative   Work: Chartered certified accountant; Insurance risk surveyor    Pets: none   Children- None    Caffeine- Soda 1 cup daily; no coffee/tea    Social Determinants of Health   Financial Resource Strain: Low Risk  (08/05/2022)   Overall Financial Resource Strain (CARDIA)    Difficulty of  Paying Living Expenses: Not very hard  Food Insecurity: No Food Insecurity (08/05/2022)   Hunger Vital Sign    Worried About Running Out of Food in the Last Year: Never true    Ran Out of Food in the Last Year: Never true  Transportation Needs: No Transportation Needs (08/05/2022)   PRAPARE - Administrator, Civil Service (Medical): No    Lack of Transportation (Non-Medical): No  Physical Activity: Insufficiently Active (08/05/2022)   Exercise Vital Sign    Days of Exercise per Week: 2 days    Minutes of Exercise per Session: 30 min  Stress: Stress Concern Present (08/05/2022)   [employer] of Occupational Health - Occupational Stress Questionnaire    Feeling of Stress : To some extent  Social Connections: Socially Integrated (08/05/2022)   Social Connection and Isolation Panel [NHANES]    Frequency of Communication with Friends and Family: More than three times a week    Frequency of Social Gatherings with Friends and Family: Once a week    Attends Religious Services: More than 4 times per year    Active Member of Clubs or  Organizations: Yes    Attends Banker Meetings: More than 4 times per year    Marital Status: Married  Catering manager Violence: Not on file    Past Surgical History:  Procedure Laterality Date   ABDOMINAL HYSTERECTOMY  2007   Partial (has ovaries)   CHOLECYSTECTOMY  2014   COLONOSCOPY WITH PROPOFOL N/A 01/15/2018   Procedure: COLONOSCOPY WITH PROPOFOL;  Surgeon: Toney Reil, MD;  Location: [facility] END[facility];  Service: Gastroenterology;  Laterality: N/A;    Family History  Problem Relation Age of Onset   Hyperlipidemia Mother    Hypertension Mother    Diabetes Father    Cancer Brother        Colon Cancer   Cancer Maternal Aunt        Ovary/Uterus/Breast   Hyperlipidemia Maternal Aunt    Hypertension Maternal Aunt    Diabetes Maternal Aunt    Cancer Maternal Uncle        Colon Cancer   Diabetes Maternal Uncle    Cancer Paternal Aunt        Breast Cancer (2 Aunts)   Diabetes Paternal Aunt    Cancer Paternal Uncle        Colon Cancer   Diabetes Paternal Uncle     No Known Allergies     Latest Ref Rng & Units 08/16/2016    1:56 PM 07/03/2015   12:22 PM 01/13/2012    2:40 PM  CBC  WBC 4.0 - 10.5 K/uL 6.3  6.8  7.5   Hemoglobin 12.0 - 15.0 g/dL 09.8  11.9  14.7   Hematocrit 36.0 - 46.0 % 39.5  41.0  34.7   Platelets 150.0 - 400.0 K/uL 311.0  324.0  273       CMP     Component Value Date/Time   NA 141 08/09/2022 1055   NA 142 01/13/2012 1440   K 4.1 08/09/2022 1055   K 3.6 01/13/2012 1440   CL 103 08/09/2022 1055   CL 106 01/13/2012 1440   CO2 28 08/09/2022 1055   CO2 32 01/13/2012 1440   GLUCOSE 100 (H) 08/09/2022 1055   GLUCOSE 89 01/13/2012 1440   BUN 16 08/09/2022 1055   BUN 7 01/13/2012 1440   CREATININE 0.82 08/09/2022 1055   CREATININE 0.90 01/13/2012 1440   CALCIUM 9.6 08/09/2022 1055  CALCIUM 8.4 (L) 01/13/2012 1440   PROT 8.4 (H) 09/30/2022 1340   PROT 6.9 01/13/2012 1440   ALBUMIN 4.1 09/30/2022 1340   ALBUMIN 3.1 (L)  01/13/2012 1440   AST 12 09/30/2022 1340   AST 42 (H) 01/13/2012 1440   ALT 17 09/30/2022 1340   ALT 81 (H) 01/13/2012 1440   ALKPHOS 82 09/30/2022 1340   ALKPHOS 68 01/13/2012 1440   BILITOT 0.9 09/30/2022 1340   BILITOT 0.7 01/13/2012 1440   GFR 77.86 08/09/2022 1055   GFRNONAA >60 01/13/2012 1440     No results found.     Assessment & Plan:   1. Abnormal ankle brachial index (ABI) The patient's ABIs are considered abnormal, the studies suggest that it is barely so.  Based upon the patient's description of pain and discomfort I believe that this is related to her lower back versus vascular abnormalities.  In addition to the labs being just slightly abnormal, she has strong bounding pulses.  Will recommend further workup and evaluation of her leg pain by neurosurgery if it is ongoing.  She will follow-up with [place] on an as-needed basis.  2. Hypertension, unspecified type Continue antihypertensive medications as already ordered, these medications have been reviewed and there are no changes at this time.  3. Controlled type 2 diabetes mellitus without complication, without long-term current use of insulin (HCC) Continue hypoglycemic medications as already ordered, these medications have been reviewed and there are no changes at this time.  Hgb A1C to be monitored as already arranged by primary service   Current Outpatient Medications on File Prior to Visit  Medication Sig Dispense Refill   amLODipine (NORVASC) 5 MG tablet TAKE 1 TABLET(5 MG) BY MOUTH DAILY 90 tablet 1   atorvastatin (LIPITOR) 80 MG tablet Take 1 tablet (80 mg total) by mouth daily. 90 tablet 1   hydrochlorothiazide (HYDRODIURIL) 12.5 MG tablet TAKE 1 TABLET(12.5 MG) BY MOUTH DAILY 90 tablet 1   hydrOXYzine (ATARAX) 25 MG tablet Take 1 tablet (25 mg total) by mouth 3 (three) times daily as needed. 30 tablet 1   pregabalin (LYRICA) 50 MG capsule Take 1 capsule (50 mg total) by mouth 2 (two) times daily. 60 capsule 3    No current facility-administered medications on file prior to visit.    There are no Patient Instructions on file for this visit. No follow-ups on file.   Georgiana Spinner, NP

## 2022-10-12 ENCOUNTER — Encounter: Payer: Self-pay | Admitting: *Deleted

## 2023-02-01 ENCOUNTER — Other Ambulatory Visit: Payer: Self-pay | Admitting: Family Medicine

## 2023-02-01 DIAGNOSIS — E785 Hyperlipidemia, unspecified: Secondary | ICD-10-CM

## 2023-02-01 DIAGNOSIS — I10 Essential (primary) hypertension: Secondary | ICD-10-CM

## 2023-02-10 ENCOUNTER — Encounter: Payer: BC Managed Care – PPO | Admitting: Nurse Practitioner

## 2023-02-10 ENCOUNTER — Ambulatory Visit: Payer: BC Managed Care – PPO | Admitting: Family Medicine

## 2023-02-24 ENCOUNTER — Other Ambulatory Visit: Payer: Self-pay | Admitting: Family Medicine

## 2023-02-24 DIAGNOSIS — E785 Hyperlipidemia, unspecified: Secondary | ICD-10-CM

## 2023-02-24 DIAGNOSIS — I10 Essential (primary) hypertension: Secondary | ICD-10-CM

## 2023-02-28 ENCOUNTER — Ambulatory Visit: Payer: BC Managed Care – PPO | Admitting: Nurse Practitioner

## 2023-02-28 ENCOUNTER — Other Ambulatory Visit: Payer: Self-pay | Admitting: Family Medicine

## 2023-02-28 ENCOUNTER — Encounter: Payer: Self-pay | Admitting: Nurse Practitioner

## 2023-02-28 VITALS — BP 134/72 | HR 99 | Temp 98.4°F | Ht 67.0 in | Wt 251.0 lb

## 2023-02-28 DIAGNOSIS — G479 Sleep disorder, unspecified: Secondary | ICD-10-CM

## 2023-02-28 DIAGNOSIS — F419 Anxiety disorder, unspecified: Secondary | ICD-10-CM

## 2023-02-28 DIAGNOSIS — I10 Essential (primary) hypertension: Secondary | ICD-10-CM

## 2023-02-28 DIAGNOSIS — M545 Low back pain, unspecified: Secondary | ICD-10-CM

## 2023-02-28 DIAGNOSIS — E119 Type 2 diabetes mellitus without complications: Secondary | ICD-10-CM | POA: Diagnosis not present

## 2023-02-28 DIAGNOSIS — K580 Irritable bowel syndrome with diarrhea: Secondary | ICD-10-CM

## 2023-02-28 DIAGNOSIS — G8929 Other chronic pain: Secondary | ICD-10-CM

## 2023-02-28 DIAGNOSIS — Z1211 Encounter for screening for malignant neoplasm of colon: Secondary | ICD-10-CM

## 2023-02-28 DIAGNOSIS — E785 Hyperlipidemia, unspecified: Secondary | ICD-10-CM

## 2023-02-28 DIAGNOSIS — Z1231 Encounter for screening mammogram for malignant neoplasm of breast: Secondary | ICD-10-CM

## 2023-02-28 LAB — POCT GLYCOSYLATED HEMOGLOBIN (HGB A1C)
HbA1c POC (<> result, manual entry): 6.5 % (ref 4.0–5.6)
HbA1c, POC (controlled diabetic range): 6.5 % (ref 0.0–7.0)
HbA1c, POC (prediabetic range): 6.5 % — AB (ref 5.7–6.4)
Hemoglobin A1C: 6.5 % — AB (ref 4.0–5.6)

## 2023-02-28 NOTE — Progress Notes (Signed)
 Miranda Dicker, NP-C Phone: [phone]  Miranda Evans is a 63 y.o. female who presents today for transfer of care.   Discussed the use of AI scribe software for clinical note transcription with the patient, who gave verbal consent to proceed.  History of Present Illness   Miranda Evans is a 64 year old female who presents for transfer of care.  She has a history of hypertension, currently managed with Norvasc (amlodipine) and hydrochlorothiazide. Her home blood pressure readings have varied, with the highest systolic being 153, but today it is 134/72. She experiences no chest pain, shortness of breath, dizziness, or swelling.  Her type 2 diabetes is diet-controlled, and she does not take medication or check her blood sugar at home. Her last A1c in August was 6.5%. She has no symptoms of hypoglycemia, excessive thirst, or excessive urination.  She has irritable bowel syndrome (IBS) with mixed constipation and diarrhea, experiencing left-sided abdominal pain that sometimes correlates with constipation. Bowel movements vary from runny to constipated, with a recent episode of constipation over the weekend. She acknowledges her diet is not optimal and has not tried the low FODMAP diet.  She experiences numbness and tingling in her legs, especially when sleeping on one side or standing for long periods. She takes Lyrica, which she believes helps. She has a history of back issues, including arthritis and a past fall, but has declined surgery. No loss of bowel or bladder control is reported.  She reports symptoms of sleep apnea, including waking up gasping, as noted by her husband. A sleep study was done several years ago, but she did not follow up due to lack of insurance at the time.  She is taking Lipitor (atorvastatin) for cholesterol and reports no muscle aches or abdominal pain.  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 She has Atarax (hydroxyzine) for  anxiety but tries not to use it often.      Social History   Tobacco Use  Smoking Status Never  Smokeless Tobacco Never    Current Outpatient Medications on File Prior to Visit  Medication Sig Dispense Refill   amLODipine (NORVASC) 5 MG tablet TAKE 1 TABLET BY MOUTH EVERY DAY 90 tablet 1   atorvastatin (LIPITOR) 80 MG tablet TAKE 1 TABLET BY MOUTH EVERY DAY 90 tablet 1   hydrOXYzine (ATARAX) 25 MG tablet Take 1 tablet (25 mg total) by mouth 3 (three) times daily as needed. 30 tablet 1   pregabalin (LYRICA) 50 MG capsule Take 1 capsule (50 mg total) by mouth 2 (two) times daily. 60 capsule 3   No current facility-administered medications on file prior to visit.    ROS see history of present illness  Objective  Physical Exam Vitals:   02/28/23 0822  BP: 134/72  Pulse: 99  Temp: 98.4 F (36.9 C)  SpO2: 95%    BP Readings from Last 3 Encounters:  02/28/23 134/72  10/07/22 122/70  08/09/22 120/82   Wt Readings from Last 3 Encounters:  02/28/23 251 lb (113.9 kg)  10/07/22 242 lb 12.8 oz (110.1 kg)  08/09/22 241 lb 3.2 oz (109.4 kg)    Physical Exam Constitutional:      General: She is not in acute distress.    Appearance: Normal appearance.  HENT:     Head: Normocephalic.  Cardiovascular:     Rate and Rhythm: Normal rate and regular rhythm.     Heart sounds: Normal heart sounds.  Pulmonary:  Effort: Pulmonary effort is normal.     Breath sounds: Normal breath sounds.  Skin:    General: Skin is warm and dry.  Neurological:     General: No focal deficit present.     Mental Status: She is alert.  Psychiatric:        Mood and Affect: Mood normal.        Behavior: Behavior normal.     Assessment/Plan: Please see individual problem list.  Sleep disturbance Assessment & Plan: Reports waking up gasping for air. Previous sleep study was done but no follow-up due to lack of insurance. Refer to pulmonology for sleep consultation and possible repeat sleep  study.  Orders: -     Ambulatory referral to Pulmonology  Irritable bowel syndrome with diarrhea Assessment & Plan: Experiences mixed constipation and diarrhea, with left-sided abdominal pain possibly related to constipation. Consider a low FODMAP diet for symptom control. Continue monitoring symptoms and correlate with bowel movements.   Hypertension, unspecified type Assessment & Plan: Blood pressure is controlled with Amlodipine 5 mg daily and Hydrochlorothiazide 12.5 mg daily. Systolic slightly elevated today. Home readings vary but are not consistently high. No symptoms of hypotension or hypertensive urgency. Continue current medications and home monitoring. Discussed goal BP of less than 130/80.    Controlled type 2 diabetes mellitus without complication, without long-term current use of insulin (HCC) Assessment & Plan: Diet controlled with a last A1C of 6.5 in August. POCT A1c today remains 6.5. Encouraged healthy diet and regular exercise.   Orders: -     POCT glycosylated hemoglobin (Hb A1C)  Hyperlipidemia, unspecified hyperlipidemia type Assessment & Plan: Atorvastatin is well-tolerated with no side effects. Continue Atorvastatin 80 mg daily.   Chronic left-sided low back pain without sciatica Assessment & Plan: Experiences numbness and discomfort, especially with prolonged standing. No interest in surgical intervention. Continue Lyrica and monitor for worsening symptoms.   Screening mammogram for breast cancer -     3D Screening Mammogram, Left and Right; Future  Screen for colon cancer -     Ambulatory referral to Gastroenterology    Return in about 6 months (around 08/28/2023) for Follow up.   Miranda Dicker, NP-C [facility] - [facility]

## 2023-02-28 NOTE — Patient Instructions (Signed)
 YOUR MAMMOGRAM IS DUE, PLEASE CALL AND GET THIS SCHEDULED! [facility] - call [phone]

## 2023-03-07 NOTE — Assessment & Plan Note (Signed)
 Reports waking up gasping for air. Previous sleep study was done but no follow-up due to lack of insurance. Refer to pulmonology for sleep consultation and possible repeat sleep study.

## 2023-03-07 NOTE — Assessment & Plan Note (Signed)
 Experiences mixed constipation and diarrhea, with left-sided abdominal pain possibly related to constipation. Consider a low FODMAP diet for symptom control. Continue monitoring symptoms and correlate with bowel movements.

## 2023-03-07 NOTE — Assessment & Plan Note (Signed)
 Experiences numbness and discomfort, especially with prolonged standing. No interest in surgical intervention. Continue Lyrica and monitor for worsening symptoms.

## 2023-03-07 NOTE — Assessment & Plan Note (Signed)
 Diet controlled with a last A1C of 6.5 in August. POCT A1c today remains 6.5. Encouraged healthy diet and regular exercise.

## 2023-03-07 NOTE — Assessment & Plan Note (Signed)
 Blood pressure is controlled with Amlodipine 5 mg daily and Hydrochlorothiazide 12.5 mg daily. Systolic slightly elevated today. Home readings vary but are not consistently high. No symptoms of hypotension or hypertensive urgency. Continue current medications and home monitoring. Discussed goal BP of less than 130/80.

## 2023-03-07 NOTE — Assessment & Plan Note (Signed)
 Atorvastatin is well-tolerated with no side effects. Continue Atorvastatin 80 mg daily.

## 2023-03-10 ENCOUNTER — Other Ambulatory Visit: Payer: Self-pay

## 2023-03-10 ENCOUNTER — Telehealth: Payer: Self-pay

## 2023-03-10 DIAGNOSIS — Z860100 Personal history of colon polyps, unspecified: Secondary | ICD-10-CM

## 2023-03-10 MED ORDER — NA SULFATE-K SULFATE-MG SULF 17.5-3.13-1.6 GM/177ML PO SOLN
1.0000 | Freq: Once | ORAL | 0 refills | Status: AC
Start: 2023-03-10 — End: 2023-03-10

## 2023-03-10 NOTE — Telephone Encounter (Signed)
 Gastroenterology Pre-Procedure Review  Request Date: 05/10/23 Requesting Physician: Dr. Allegra Lai  PATIENT REVIEW QUESTIONS: The patient responded to the following health history questions as indicated:    1. Are you having any GI issues? no 2. Do you have a personal history of Polyps? yes (LAST COLONOSCOPY 01/15/18 performed by Dr. Allegra Lai recommended repeat in 5 years ) 3. Do you have a family history of Colon Cancer or Polyps? no 4. Diabetes Mellitus? no 5. Joint replacements in the past 12 months?no 6. Major health problems in the past 3 months?no 7. Any artificial heart valves, MVP, or defibrillator?no    MEDICATIONS & ALLERGIES:    Patient reports the following regarding taking any anticoagulation/antiplatelet therapy:   Plavix, Coumadin, Eliquis, Xarelto, Lovenox, Pradaxa, Brilinta, or Effient? no Aspirin? no  Patient confirms/reports the following medications:  Current Outpatient Medications  Medication Sig Dispense Refill   amLODipine (NORVASC) 5 MG tablet TAKE 1 TABLET BY MOUTH EVERY DAY 90 tablet 1   atorvastatin (LIPITOR) 80 MG tablet TAKE 1 TABLET BY MOUTH EVERY DAY 90 tablet 1   hydrochlorothiazide (HYDRODIURIL) 12.5 MG tablet TAKE 1 TABLET BY MOUTH EVERY DAY 90 tablet 3   hydrOXYzine (ATARAX) 25 MG tablet Take 1 tablet (25 mg total) by mouth 3 (three) times daily as needed. 30 tablet 1   pregabalin (LYRICA) 50 MG capsule Take 1 capsule (50 mg total) by mouth 2 (two) times daily. 60 capsule 3   No current facility-administered medications for this visit.    Patient confirms/reports the following allergies:  No Known Allergies  No orders of the defined types were placed in this encounter.   AUTHORIZATION INFORMATION Primary Insurance: 1D#: Group #:  Secondary Insurance: 1D#: Group #:  SCHEDULE INFORMATION: Date: 05/10/23 Time: Location: [facility]

## 2023-03-14 ENCOUNTER — Encounter: Payer: Self-pay | Admitting: Sleep Medicine

## 2023-03-14 ENCOUNTER — Ambulatory Visit: Admitting: Sleep Medicine

## 2023-03-14 VITALS — BP 146/90 | HR 90 | Temp 97.6°F | Ht 67.0 in | Wt 252.8 lb

## 2023-03-14 DIAGNOSIS — E785 Hyperlipidemia, unspecified: Secondary | ICD-10-CM | POA: Diagnosis not present

## 2023-03-14 DIAGNOSIS — I10 Essential (primary) hypertension: Secondary | ICD-10-CM | POA: Diagnosis not present

## 2023-03-14 DIAGNOSIS — G4733 Obstructive sleep apnea (adult) (pediatric): Secondary | ICD-10-CM | POA: Diagnosis not present

## 2023-03-14 NOTE — Patient Instructions (Signed)
 Marland Kitchen

## 2023-03-14 NOTE — Progress Notes (Signed)
 Name:Miranda Evans MRN: [id number] DOB: 1960/09/21   CHIEF COMPLAINT:  EXCESSIVE DAYTIME SLEEPINESS   HISTORY OF PRESENT ILLNESS:  Miranda Evans is a 63 y.o. w/ a h/o HTN, hyperlipidemia, morbid obesity and who presents for c/o loud snoring, witnessed apnea and excessive daytime sleepiness which has been present for several years. Reports nocturnal awakenings due to nocturia, however does not have difficulty falling back to sleep. Reports a 50-60 lb weight gain over the last several years. Admits to dry mouth and morning headaches. Denies night sweats, dream enactment, cataplexy, hypnagogic or hypnapompic hallucinations. Reports a family history of sleep apnea. Denies drowsy driving. Drinks 2 sodas daily, occasional alcohol use, denies tobacco or illicit drug use.   Bedtime 10:30 pm Sleep onset 20 mins Rise time 6:30 am   EPWORTH SLEEP SCORE 6    03/14/2023    4:03 PM  Results of the Epworth flowsheet  Sitting and reading 2  Watching TV 1  Sitting, inactive in a public place (e.g. a theatre or a meeting) 0  As a passenger in a car for an hour without a break 1  Lying down to rest in the afternoon when circumstances permit 1  Sitting and talking to someone 0  Sitting quietly after a lunch without alcohol 1  In a car, while stopped for a few minutes in traffic 0  Total score 6     PAST MEDICAL HISTORY :   has a past medical history of Anxiety, Depression, Diarrhea (07/03/2015), Hypertension, Neuromuscular disorder (HCC), and Sleep apnea.  has a past surgical history that includes Cholecystectomy (2014); Abdominal hysterectomy (2007); and Colonoscopy with propofol (N/A, 01/15/2018). Prior to Admission medications   Medication Sig Start Date End Date Taking? Authorizing Provider  amLODipine (NORVASC) 5 MG tablet TAKE 1 TABLET BY MOUTH EVERY DAY 02/24/23   Miranda Luis, MD  atorvastatin (LIPITOR) 80 MG tablet TAKE 1 TABLET BY MOUTH EVERY DAY 02/24/23   Miranda Luis,  MD  hydrochlorothiazide (HYDRODIURIL) 12.5 MG tablet TAKE 1 TABLET BY MOUTH EVERY DAY 03/01/23   Miranda Dicker, NP  hydrOXYzine (ATARAX) 25 MG tablet Take 1 tablet (25 mg total) by mouth 3 (three) times daily as needed. 08/09/22   Miranda Luis, MD  pregabalin (LYRICA) 50 MG capsule Take 1 capsule (50 mg total) by mouth 2 (two) times daily. Patient not taking: Reported on 03/14/2023 08/09/22   Miranda Luis, MD   No Known Allergies  FAMILY HISTORY:  family history includes Cancer in her brother, maternal aunt, maternal uncle, paternal aunt, and paternal uncle; Diabetes in her father, maternal aunt, maternal uncle, paternal aunt, and paternal uncle; Hyperlipidemia in her maternal aunt and mother; Hypertension in her maternal aunt and mother. SOCIAL HISTORY:  reports that she has never smoked. She has never used smokeless tobacco. She reports current alcohol use. She reports that she does not use drugs.   Review of Systems:  Gen:  Denies  fever, sweats, chills weight loss  HEENT: Denies blurred vision, double vision, ear pain, eye pain, hearing loss, nose bleeds, sore throat Cardiac:  No dizziness, chest pain or heaviness, chest tightness,edema, No JVD Resp:   No cough, -sputum production, -shortness of breath,-wheezing, -hemoptysis,  Gi: Denies swallowing difficulty, stomach pain, nausea or vomiting, diarrhea, constipation, bowel incontinence Gu:  Denies bladder incontinence, burning urine Ext:   Denies Joint pain, stiffness or swelling Skin: Denies  skin rash, easy bruising or bleeding or hives Endoc:  Denies  polyuria, polydipsia , polyphagia or weight change Psych:   Denies depression, insomnia or hallucinations  Other:  All other systems negative  VITAL SIGNS: BP (!) 146/90 (BP Location: Left Arm, Patient Position: Sitting, Cuff Size: Normal)   Pulse 90   Temp 97.6 F (36.4 C) (Temporal)   Ht 5\' 7"  (1.702 m)   Wt 252 lb 12.8 oz (114.7 kg)   LMP  (LMP Unknown)   SpO2 99%    BMI 39.59 kg/m    Physical Examination:   General Appearance: No distress  EYES PERRLA, EOM intact.   NECK Supple, No JVD Mallampati IV Pulmonary: normal breath sounds, No wheezing.  CardiovascularNormal S1,S2.  No m/r/g.   Abdomen: Benign, Soft, non-tender. Skin:   warm, no rashes, no ecchymosis  Extremities: normal, no cyanosis, clubbing. Neuro:without focal findings,  speech normal  PSYCHIATRIC: Mood, affect within normal limits.   ASSESSMENT AND PLAN  OSA I suspect that OSA is likely present due to clinical presentation. Discussed the consequences of untreated sleep apnea. Advised not to drive drowsy for safety of patient and others. Will complete further evaluation with a home sleep study and follow up to review results.    HTN Stable, on current management. Following with PCP.   Hyperlipidemia Stable, on current management.   Morbid obesity Counseled patient on diet and lifestyle modification. Also discussed Tirzepatide as another treatment option for weight loss.    MEDICATION ADJUSTMENTS/LABS AND TESTS ORDERED: Recommend Sleep Study   Patient  satisfied with Plan of action and management. All questions answered  Follow up to review HST results and treatment plan.   I spent a total of 30 minutes reviewing chart data, face-to-face evaluation with the patient, counseling and coordination of care as detailed above.    Tempie Hoist, M.D.  Sleep Medicine [facility]

## 2023-03-23 ENCOUNTER — Telehealth: Payer: Self-pay

## 2023-03-23 NOTE — Telephone Encounter (Signed)
 Pt would like to change her May 7th procedure to a different date.  Her insurance is going to run out as of April 30th. Pt states she would like to have it done asap.

## 2023-03-23 NOTE — Telephone Encounter (Signed)
 Dava in Endo has been asked to place procedure ordered in the depot for [facility].  She is being rescheduled to [facility] for a sooner date due to insurance 04/17/23 will be the new date.  Will call [facility] tomorrow and message Selena Batten to make her aware.  Thanks,  [place], [place]

## 2023-03-25 ENCOUNTER — Encounter

## 2023-03-25 DIAGNOSIS — G4733 Obstructive sleep apnea (adult) (pediatric): Secondary | ICD-10-CM

## 2023-04-05 ENCOUNTER — Encounter: Payer: Self-pay | Admitting: Gastroenterology

## 2023-04-06 DIAGNOSIS — G4733 Obstructive sleep apnea (adult) (pediatric): Secondary | ICD-10-CM | POA: Diagnosis not present

## 2023-04-12 ENCOUNTER — Encounter: Payer: Self-pay | Admitting: Sleep Medicine

## 2023-04-12 ENCOUNTER — Ambulatory Visit: Admitting: Sleep Medicine

## 2023-04-12 VITALS — BP 116/76 | HR 79 | Temp 97.9°F | Ht 67.0 in | Wt 253.0 lb

## 2023-04-12 DIAGNOSIS — E785 Hyperlipidemia, unspecified: Secondary | ICD-10-CM | POA: Diagnosis not present

## 2023-04-12 DIAGNOSIS — G4733 Obstructive sleep apnea (adult) (pediatric): Secondary | ICD-10-CM

## 2023-04-12 DIAGNOSIS — I10 Essential (primary) hypertension: Secondary | ICD-10-CM | POA: Diagnosis not present

## 2023-04-12 NOTE — Progress Notes (Signed)
 Name:Miranda Evans MRN: [id number] DOB: 1960-07-10   CHIEF COMPLAINT:  HST F/U   HISTORY OF PRESENT ILLNESS:  Miranda Evans is a 63 y.o. w/ a h/o OSA, hyperlipidemia, HTN and morbid obesity who presents to follow up on HST results. The patient underwent HST which revealed moderate OSA (AHI 18, O2 nadir 70%).    EPWORTH SLEEP SCORE    03/14/2023    4:03 PM  Results of the Epworth flowsheet  Sitting and reading 2  Watching TV 1  Sitting, inactive in a public place (e.g. a theatre or a meeting) 0  As a passenger in a car for an hour without a break 1  Lying down to rest in the afternoon when circumstances permit 1  Sitting and talking to someone 0  Sitting quietly after a lunch without alcohol 1  In a car, while stopped for a few minutes in traffic 0  Total score 6      PAST MEDICAL HISTORY :   has a past medical history of Anxiety, Depression, Diarrhea (07/03/2015), Hypertension, Neuromuscular disorder (HCC), Pre-diabetes, and Sleep apnea.  has a past surgical history that includes Cholecystectomy (2014); Abdominal hysterectomy (2007); and Colonoscopy with propofol (N/A, 01/15/2018). Prior to Admission medications   Medication Sig Start Date End Date Taking? Authorizing Provider  amLODipine (NORVASC) 5 MG tablet TAKE 1 TABLET BY MOUTH EVERY DAY 02/24/23  Yes Glori Luis, MD  atorvastatin (LIPITOR) 80 MG tablet TAKE 1 TABLET BY MOUTH EVERY DAY 02/24/23  Yes Glori Luis, MD  hydrochlorothiazide (HYDRODIURIL) 12.5 MG tablet TAKE 1 TABLET BY MOUTH EVERY DAY 03/01/23  Yes Bethanie Dicker, NP  hydrOXYzine (ATARAX) 25 MG tablet Take 1 tablet (25 mg total) by mouth 3 (three) times daily as needed. 08/09/22  Yes Glori Luis, MD  pregabalin (LYRICA) 50 MG capsule Take 1 capsule (50 mg total) by mouth 2 (two) times daily. 08/09/22  Yes Glori Luis, MD  gabapentin (NEURONTIN) 100 MG capsule Take 100 mg by mouth 3 (three) times daily. Patient not taking: Reported on  04/12/2023    [provider]   No Known Allergies  FAMILY HISTORY:  family history includes Cancer in her brother, maternal aunt, maternal uncle, paternal aunt, and paternal uncle; Diabetes in her father, maternal aunt, maternal uncle, paternal aunt, and paternal uncle; Hyperlipidemia in her maternal aunt and mother; Hypertension in her maternal aunt and mother. SOCIAL HISTORY:  reports that she has never smoked. She has never used smokeless tobacco. She reports current alcohol use. She reports that she does not use drugs.   Review of Systems:  Gen:  Denies  fever, sweats, chills weight loss  HEENT: Denies blurred vision, double vision, ear pain, eye pain, hearing loss, nose bleeds, sore throat Cardiac:  No dizziness, chest pain or heaviness, chest tightness,edema, No JVD Resp:   No cough, -sputum production, -shortness of breath,-wheezing, -hemoptysis,  Gi: Denies swallowing difficulty, stomach pain, nausea or vomiting, diarrhea, constipation, bowel incontinence Gu:  Denies bladder incontinence, burning urine Ext:   Denies Joint pain, stiffness or swelling Skin: Denies  skin rash, easy bruising or bleeding or hives Endoc:  Denies polyuria, polydipsia , polyphagia or weight change Psych:   Denies depression, insomnia or hallucinations  Other:  All other systems negative  VITAL SIGNS: BP 116/76 (BP Location: Left Arm, Patient Position: Sitting, Cuff Size: Normal)   Pulse 79   Temp 97.9 F (36.6 C) (Temporal)   Ht 5\' 7"  (  1.702 m)   Wt 253 lb (114.8 kg)   LMP  (LMP Unknown)   SpO2 98%   BMI 39.63 kg/m     Physical Examination:   General Appearance: No distress  EYES PERRLA, EOM intact.   NECK Supple, No JVD Pulmonary: normal breath sounds, No wheezing.  CardiovascularNormal S1,S2.  No m/r/g.   Abdomen: Benign, Soft, non-tender. Skin:   warm, no rashes, no ecchymosis  Extremities: normal, no cyanosis, clubbing. Neuro:without focal findings,  speech normal   PSYCHIATRIC: Mood, affect within normal limits.   ASSESSMENT AND PLAN  OSA Reviewed HST results with patient. Starting on APAP therapy set to 4-16 cm H2O. Discussed the consequences of untreated sleep apnea. Advised not to drive drowsy for safety of patient and others. Will follow up in 3 months to review CPAP efficacy and compliance data.   HTN Stable, on current management. Following with PCP.   Hyperlipidemia Stable, on current management.   Morbid obesity Counseled patient on diet and lifestyle modification. Also discussed Zepbound as an option for weight loss.    Patient  satisfied with Plan of action and management. All questions answered  I spent a total of 40 minutes reviewing chart data, face-to-face evaluation with the patient, counseling and coordination of care as detailed above.    Tempie Hoist, M.D.  Sleep Medicine [facility]

## 2023-04-12 NOTE — Patient Instructions (Signed)

## 2023-04-17 ENCOUNTER — Ambulatory Visit
Admission: RE | Admit: 2023-04-17 | Discharge: 2023-04-17 | Disposition: A | Discharge status: Home / Self Care | Attending: Gastroenterology | Admitting: Gastroenterology

## 2023-04-17 ENCOUNTER — Ambulatory Visit: Payer: Self-pay | Admitting: Anesthesiology

## 2023-04-17 ENCOUNTER — Encounter: Payer: Self-pay | Admitting: Gastroenterology

## 2023-04-17 ENCOUNTER — Other Ambulatory Visit: Payer: Self-pay

## 2023-04-17 ENCOUNTER — Encounter
Admission: RE | Disposition: A | Discharge status: Home / Self Care | Payer: Self-pay | Source: Home / Self Care | Attending: Gastroenterology

## 2023-04-17 DIAGNOSIS — I10 Essential (primary) hypertension: Secondary | ICD-10-CM | POA: Diagnosis not present

## 2023-04-17 DIAGNOSIS — Z1211 Encounter for screening for malignant neoplasm of colon: Secondary | ICD-10-CM | POA: Insufficient documentation

## 2023-04-17 DIAGNOSIS — E669 Obesity, unspecified: Secondary | ICD-10-CM | POA: Diagnosis not present

## 2023-04-17 DIAGNOSIS — K644 Residual hemorrhoidal skin tags: Secondary | ICD-10-CM | POA: Insufficient documentation

## 2023-04-17 DIAGNOSIS — E119 Type 2 diabetes mellitus without complications: Secondary | ICD-10-CM | POA: Diagnosis not present

## 2023-04-17 DIAGNOSIS — Z860100 Personal history of colon polyps, unspecified: Secondary | ICD-10-CM

## 2023-04-17 DIAGNOSIS — Z800 Family history of malignant neoplasm of digestive organs: Secondary | ICD-10-CM | POA: Diagnosis not present

## 2023-04-17 DIAGNOSIS — K5730 Diverticulosis of large intestine without perforation or abscess without bleeding: Secondary | ICD-10-CM | POA: Insufficient documentation

## 2023-04-17 DIAGNOSIS — G4733 Obstructive sleep apnea (adult) (pediatric): Secondary | ICD-10-CM | POA: Insufficient documentation

## 2023-04-17 DIAGNOSIS — Z6838 Body mass index (BMI) 38.0-38.9, adult: Secondary | ICD-10-CM | POA: Insufficient documentation

## 2023-04-17 HISTORY — PX: COLONOSCOPY: SHX5424

## 2023-04-17 HISTORY — DX: Obstructive sleep apnea (adult) (pediatric): G47.33

## 2023-04-17 HISTORY — DX: Prediabetes: R73.03

## 2023-04-17 HISTORY — DX: Obesity, unspecified: E66.9

## 2023-04-17 SURGERY — COLONOSCOPY
Anesthesia: General | Site: Rectum

## 2023-04-17 MED ORDER — PROPOFOL 500 MG/50ML IV EMUL
INTRAVENOUS | Status: DC | PRN
Start: 2023-04-17 — End: 2023-04-17
  Administered 2023-04-17: 125 ug/kg/min via INTRAVENOUS

## 2023-04-17 MED ORDER — LIDOCAINE HCL (PF) 2 % IJ SOLN
INTRAMUSCULAR | Status: AC
  Filled 2023-04-17: qty 5

## 2023-04-17 MED ORDER — PROPOFOL 10 MG/ML IV BOLUS
INTRAVENOUS | Status: AC
  Filled 2023-04-17: qty 20

## 2023-04-17 MED ORDER — SODIUM CHLORIDE 0.9 % IV SOLN: INTRAVENOUS | Status: DC

## 2023-04-17 MED ORDER — LIDOCAINE HCL (CARDIAC) PF 100 MG/5ML IV SOSY
PREFILLED_SYRINGE | INTRAVENOUS | Status: DC | PRN
  Administered 2023-04-17: 60 mg via INTRAVENOUS

## 2023-04-17 MED ORDER — PROPOFOL 1000 MG/100ML IV EMUL
INTRAVENOUS | Status: AC
  Filled 2023-04-17: qty 100

## 2023-04-17 MED ORDER — STERILE WATER FOR IRRIGATION IR SOLN
Status: DC | PRN
  Administered 2023-04-17: 1

## 2023-04-17 MED ORDER — PROPOFOL 10 MG/ML IV BOLUS
INTRAVENOUS | Status: DC | PRN
  Administered 2023-04-17: 60 mg via INTRAVENOUS

## 2023-04-17 SURGICAL SUPPLY — 16 items
CLIP HMST 235XBRD CATH ROT (MISCELLANEOUS) IMPLANT
ELECT REM PT RETURN 9FT ADLT (ELECTROSURGICAL) IMPLANT
ELECTRODE REM PT RTRN 9FT ADLT (ELECTROSURGICAL) IMPLANT
FORCEPS BIOP RAD 4 LRG CAP 4 (CUTTING FORCEPS) IMPLANT
GOWN CVR UNV OPN BCK APRN NK (MISCELLANEOUS) ×2 IMPLANT
INJECTOR VARIJECT VIN23 (MISCELLANEOUS) IMPLANT
KIT DEFENDO VALVE AND CONN (KITS) IMPLANT
KIT PRC NS LF DISP ENDO (KITS) ×1 IMPLANT
MANIFOLD NEPTUNE II (INSTRUMENTS) ×1 IMPLANT
MARKER SPOT ENDO TATTOO 5ML (MISCELLANEOUS) IMPLANT
PROBE APC STR FIRE (PROBE) IMPLANT
RETRIEVER NET ROTH 2.5X230 LF (MISCELLANEOUS) IMPLANT
SNARE COLD EXACTO (MISCELLANEOUS) IMPLANT
TRAP ETRAP POLY (MISCELLANEOUS) IMPLANT
VARIJECT INJECTOR VIN23 (MISCELLANEOUS) IMPLANT
WATER STERILE IRR 250ML POUR (IV SOLUTION) ×1 IMPLANT

## 2023-04-17 NOTE — Anesthesia Preprocedure Evaluation (Addendum)
 Anesthesia Evaluation  Patient identified by MRN, date of birth, ID band Patient awake    Reviewed: Allergy & Precautions, H&P , NPO status , Patient's Chart, lab work & pertinent test results  Airway Mallampati: III  TM Distance: >3 FB Neck ROM: Full  Mouth opening: Limited Mouth Opening  Dental no notable dental hx.    Pulmonary sleep apnea    Pulmonary exam normal breath sounds clear to auscultation       Cardiovascular hypertension, Normal cardiovascular exam Rhythm:Regular Rate:Normal     Neuro/Psych  Headaches PSYCHIATRIC DISORDERS Anxiety Depression     Neuromuscular disease    GI/Hepatic negative GI ROS, Neg liver ROS,,,  Endo/Other  diabetes    Renal/GU negative Renal ROS  negative genitourinary   Musculoskeletal negative musculoskeletal ROS (+)    Abdominal   Peds negative pediatric ROS (+)  Hematology negative hematology ROS (+)   Anesthesia Other Findings Diarrhea  Anxiety Depression  Hypertension Neuromuscular disorder (HCC)  Sleep apnea, is awaiting her CPAP machine Pre-diabetes  Obesity, BMI 38.69   Reproductive/Obstetrics negative OB ROS                             Anesthesia Physical Anesthesia Plan  ASA: 2  Anesthesia Plan: General   Post-op Pain Management:    Induction: Intravenous  PONV Risk Score and Plan:   Airway Management Planned: Natural Airway and Nasal Cannula  Additional Equipment:   Intra-op Plan:   Post-operative Plan:   Informed Consent: I have reviewed the patients History and Physical, chart, labs and discussed the procedure including the risks, benefits and alternatives for the proposed anesthesia with the patient or authorized representative who has indicated his/her understanding and acceptance.     Dental Advisory Given  Plan Discussed with: Anesthesiologist, CRNA and Surgeon  Anesthesia Plan Comments: (Patient consented for  risks of anesthesia including but not limited to:  - adverse reactions to medications - risk of airway placement if required - damage to eyes, teeth, lips or other oral mucosa - nerve damage due to positioning  - sore throat or hoarseness - Damage to heart, brain, nerves, lungs, other parts of body or loss of life  Patient voiced understanding and assent.)       Anesthesia Quick Evaluation

## 2023-04-17 NOTE — Op Note (Signed)
 [facility] Gastroenterology Patient Name: Markeita Alicia Procedure Date: 04/17/2023 9:35 AM MRN: [id number] Account #: [id number] Date of Birth: 10/04/1960 Admit Type: Outpatient Age: 63 Room: [facility] OR ROOM 01 Gender: Female Note Status: Finalized Instrument Name: [id number] Procedure:             Colonoscopy Indications:           Screening in patient at increased risk: Colorectal                         cancer in brother before age 26, Last colonoscopy:                         January 2020 Providers:             Toney Reil MD, MD Referring MD:          Toney Reil MD, MD (Referring MD), Bethanie Dicker                         (Referring MD) Medicines:             General Anesthesia Complications:         No immediate complications. Estimated blood loss: None. Procedure:             Pre-Anesthesia Assessment:                        - Prior to the procedure, a History and Physical was                         performed, and patient medications and allergies were                         reviewed. The patient is competent. The risks and                         benefits of the procedure and the sedation options and                         risks were discussed with the patient. All questions                         were answered and informed consent was obtained.                         Patient identification and proposed procedure were                         verified by the physician, the nurse, the                         anesthesiologist, the anesthetist and the technician                         in the pre-procedure area in the procedure room in the                         endoscopy suite. Mental Status Examination: alert and  oriented. Airway Examination: normal oropharyngeal                         airway and neck mobility. Respiratory Examination:                         clear to auscultation. CV Examination: normal.                          Prophylactic Antibiotics: The patient does not require                         prophylactic antibiotics. Prior Anticoagulants: The                         patient has taken no anticoagulant or antiplatelet                         agents. ASA Grade Assessment: II - A patient with mild                         systemic disease. After reviewing the risks and                         benefits, the patient was deemed in satisfactory                         condition to undergo the procedure. The anesthesia                         plan was to use general anesthesia. Immediately prior                         to administration of medications, the patient was                         re-assessed for adequacy to receive sedatives. The                         heart rate, respiratory rate, oxygen saturations,                         blood pressure, adequacy of pulmonary ventilation, and                         response to care were monitored throughout the                         procedure. The physical status of the patient was                         re-assessed after the procedure.                        After obtaining informed consent, the colonoscope was                         passed under direct vision. Throughout the procedure,  the patient's blood pressure, pulse, and oxygen                         saturations were monitored continuously. The                         Colonoscope was introduced through the anus and                         advanced to the the terminal ileum, with                         identification of the appendiceal orifice and IC                         valve. The colonoscopy was performed without                         difficulty. The patient tolerated the procedure well.                         The quality of the bowel preparation was evaluated                         using the BBPS [facility] Bowel Preparation Scale) with                          scores of: Right Colon = 3, Transverse Colon = 3 and                         Left Colon = 3 (entire mucosa seen well with no                         residual staining, small fragments of stool or opaque                         liquid). The total BBPS score equals 9. The terminal                         ileum, ileocecal valve, appendiceal orifice, and                         rectum were photographed. Findings:      Skin tags were found on perianal exam.      A few diverticula were found in the sigmoid colon and descending colon.      Non-bleeding external hemorrhoids were found during retroflexion. The       hemorrhoids were medium-sized.      The terminal ileum appeared normal. Impression:            - Perianal skin tags found on perianal exam.                        - Diverticulosis in the sigmoid colon and in the                         descending colon.                        -  Non-bleeding external hemorrhoids.                        - The examined portion of the ileum was normal.                        - No specimens collected. Recommendation:        - Discharge patient to home (with escort).                        - Resume previous diet today.                        - Continue present medications.                        - Repeat colonoscopy in 5 years for surveillance. Procedure Code(s):     --- Professional ---                        Z6109, Colorectal cancer screening; colonoscopy on                         individual at high risk Diagnosis Code(s):     --- Professional ---                        K64.4, Residual hemorrhoidal skin tags                        Z80.0, Family history of malignant neoplasm of                         digestive organs                        K57.30, Diverticulosis of large intestine without                         perforation or abscess without bleeding CPT copyright 2022 American Medical Association. All rights reserved. The codes documented in this  report are preliminary and upon coder review may  be revised to meet current compliance requirements. Dr. Evia Hof Selena Daily MD, MD 04/17/2023 10:12:57 AM This report has been signed electronically. Number of Addenda: 0 Note Initiated On: 04/17/2023 9:35 AM Scope Withdrawal Time: 0 hours 10 minutes 22 seconds  Total Procedure Duration: 0 hours 13 minutes 58 seconds  Estimated Blood Loss:  Estimated blood loss: none.      [facility]

## 2023-04-17 NOTE — H&P (Signed)
 Miranda Oz, MD [address]  [address]  [place], [place] [address]  Main: [phone]  Fax: [phone] Pager: [phone]  Primary Care Physician:  Bluford Burkitt, NP Primary Gastroenterologist:  Dr. Karma Evans  Pre-Procedure History & Physical: HPI:  Miranda Evans is a 63 y.o. female is here for an colonoscopy.   Past Medical History:  Diagnosis Date   Anxiety    Depression    Diarrhea 07/03/2015   Hypertension    Neuromuscular disorder (HCC)    Obesity (BMI 30-39.9)    OSA on CPAP    Pre-diabetes    Sleep apnea     Past Surgical History:  Procedure Laterality Date   ABDOMINAL HYSTERECTOMY  2007   Partial (has ovaries)   CHOLECYSTECTOMY  2014   COLONOSCOPY WITH PROPOFOL N/A 01/15/2018   Procedure: COLONOSCOPY WITH PROPOFOL;  Surgeon: Selena Daily, MD;  Location: [facility] ENDOSCOPY;  Service: Gastroenterology;  Laterality: N/A;    Prior to Admission medications   Medication Sig Start Date End Date Taking? Authorizing Provider  amLODipine (NORVASC) 5 MG tablet TAKE 1 TABLET BY MOUTH EVERY DAY 02/24/23  Yes Kent Pear, MD  atorvastatin (LIPITOR) 80 MG tablet TAKE 1 TABLET BY MOUTH EVERY DAY 02/24/23  Yes Kent Pear, MD  gabapentin (NEURONTIN) 100 MG capsule Take 100 mg by mouth 3 (three) times daily.   Yes [provider]  hydrochlorothiazide (HYDRODIURIL) 12.5 MG tablet TAKE 1 TABLET BY MOUTH EVERY DAY 03/01/23  Yes Bluford Burkitt, NP  hydrOXYzine (ATARAX) 25 MG tablet Take 1 tablet (25 mg total) by mouth 3 (three) times daily as needed. 08/09/22  Yes Kent Pear, MD  pregabalin (LYRICA) 50 MG capsule Take 1 capsule (50 mg total) by mouth 2 (two) times daily. 08/09/22  Yes Kent Pear, MD    Allergies as of 03/10/2023   (No Known Allergies)    Family History  Problem Relation Age of Onset   Hyperlipidemia Mother    Hypertension Mother    Diabetes Father    Cancer Brother        Colon Cancer   Cancer Maternal Aunt         Ovary/Uterus/Breast   Hyperlipidemia Maternal Aunt    Hypertension Maternal Aunt    Diabetes Maternal Aunt    Cancer Maternal Uncle        Colon Cancer   Diabetes Maternal Uncle    Cancer Paternal Aunt        Breast Cancer (2 Aunts)   Diabetes Paternal Aunt    Cancer Paternal Uncle        Colon Cancer   Diabetes Paternal Uncle     Social History   Socioeconomic History   Marital status: Married    Spouse name: Not on file   Number of children: Not on file   Years of education: Not on file   Highest education level: Associate degree: academic program  Occupational History   Not on file  Tobacco Use   Smoking status: Never   Smokeless tobacco: Never  Vaping Use   Vaping status: Never Used  Substance and Sexual Activity   Alcohol use: Yes    Alcohol/week: 0.0 standard drinks of alcohol    Comment: Rare    Drug use: No   Sexual activity: Yes    Partners: Male    Birth control/protection: Surgical    Comment: Husband  Other Topics Concern   Not on file  Social History Narrative  Work: Chartered certified accountant; Insurance risk surveyor    Pets: none   Children- None    Caffeine- Soda 1 cup daily; no coffee/tea    Social Drivers of Corporate investment banker Strain: Low Risk  (08/05/2022)   Overall Financial Resource Strain (CARDIA)    Difficulty of Paying Living Expenses: Not very hard  Food Insecurity: No Food Insecurity (08/05/2022)   Hunger Vital Sign    Worried About Running Out of Food in the Last Year: Never true    Ran Out of Food in the Last Year: Never true  Transportation Needs: No Transportation Needs (08/05/2022)   PRAPARE - Administrator, Civil Service (Medical): No    Lack of Transportation (Non-Medical): No  Physical Activity: Insufficiently Active (08/05/2022)   Exercise Vital Sign    Days of Exercise per Week: 2 days    Minutes of Exercise per Session: 30 min  Stress: Stress Concern Present (08/05/2022)   [employer] of  Occupational Health - Occupational Stress Questionnaire    Feeling of Stress : To some extent  Social Connections: Socially Integrated (08/05/2022)   Social Connection and Isolation Panel [NHANES]    Frequency of Communication with Friends and Family: More than three times a week    Frequency of Social Gatherings with Friends and Family: Once a week    Attends Religious Services: More than 4 times per year    Active Member of [employer] or Organizations: Yes    Attends Engineer, structural: More than 4 times per year    Marital Status: Married  Catering manager Violence: Not on file    Review of Systems: See HPI, otherwise negative ROS  Physical Exam: BP 120/64   Pulse 98   Temp 98 F (36.7 C) (Temporal)   Resp 11   Ht 5\' 7"  (1.702 m)   Wt 112 kg   LMP  (LMP Unknown)   SpO2 97%   BMI 38.69 kg/m  General:   Alert,  pleasant and cooperative in NAD Head:  Normocephalic and atraumatic. Neck:  Supple; no masses or thyromegaly. Lungs:  Clear throughout to auscultation.    Heart:  Regular rate and rhythm. Abdomen:  Soft, nontender and nondistended. Normal bowel sounds, without guarding, and without rebound.   Neurologic:  Alert and  oriented x4;  grossly normal neurologically.  Impression/Plan: Miranda Evans is here for an colonoscopy to be performed for colon cancer screening, family h/o colon cancer in brother below 66years of age  Risks, benefits, limitations, and alternatives regarding  colonoscopy have been reviewed with the patient.  Questions have been answered.  All parties agreeable.   Ellis Guys, MD  04/17/2023, 9:32 AM

## 2023-04-17 NOTE — Anesthesia Postprocedure Evaluation (Signed)
 Anesthesia Post Note  Patient: Miranda Evans  Procedure(s) Performed: COLONOSCOPY (Rectum)  Patient location during evaluation: PACU Anesthesia Type: General Level of consciousness: awake and alert Pain management: pain level controlled Vital Signs Assessment: post-procedure vital signs reviewed and stable Respiratory status: spontaneous breathing, nonlabored ventilation, respiratory function stable and patient connected to nasal cannula oxygen Cardiovascular status: stable and blood pressure returned to baseline Postop Assessment: no apparent nausea or vomiting Anesthetic complications: no   No notable events documented.   Last Vitals:  Vitals:   04/17/23 1010 04/17/23 1018  BP: 116/74 117/70  Pulse:  94  Resp: 18 19  Temp: 36.6 C (!) 36.4 C  SpO2: 98% 100%    Last Pain:  Vitals:   04/17/23 1018  TempSrc:   PainSc: 0-No pain                 Neveyah Garzon C Chiron Campione

## 2023-04-17 NOTE — Transfer of Care (Signed)
 Immediate Anesthesia Transfer of Care Note  Patient: Miranda Evans  Procedure(s) Performed: COLONOSCOPY (Rectum)  Patient Location: PACU  Anesthesia Type: General  Level of Consciousness: awake, alert  and patient cooperative  Airway and Oxygen Therapy: Patient Spontanous Breathing and Patient connected to supplemental oxygen  Post-op Assessment: Post-op Vital signs reviewed, Patient's Cardiovascular Status Stable, Respiratory Function Stable, Patent Airway and No signs of Nausea or vomiting  Post-op Vital Signs: Reviewed and stable  Complications: No notable events documented.

## 2023-05-23 ENCOUNTER — Encounter (INDEPENDENT_AMBULATORY_CARE_PROVIDER_SITE_OTHER): Payer: Self-pay

## 2023-07-12 ENCOUNTER — Ambulatory Visit: Admitting: Sleep Medicine

## 2023-08-29 ENCOUNTER — Ambulatory Visit: Payer: BC Managed Care – PPO | Admitting: Nurse Practitioner

## 2023-11-21 NOTE — Telephone Encounter (Signed)
 Open in error
